# Patient Record
Sex: Female | Born: 1975 | Race: White | Hispanic: No | Marital: Married | Smoking: Never smoker
Health system: Southern US, Community
[De-identification: ages and names within clinical notes are randomized; demographics above are authoritative.]

## PROBLEM LIST (undated history)

## (undated) DIAGNOSIS — L309 Dermatitis, unspecified: Secondary | ICD-10-CM

## (undated) DIAGNOSIS — F419 Anxiety disorder, unspecified: Secondary | ICD-10-CM

## (undated) DIAGNOSIS — Z8619 Personal history of other infectious and parasitic diseases: Secondary | ICD-10-CM

## (undated) DIAGNOSIS — G4700 Insomnia, unspecified: Secondary | ICD-10-CM

## (undated) DIAGNOSIS — K219 Gastro-esophageal reflux disease without esophagitis: Secondary | ICD-10-CM

---

## 2017-10-23 ENCOUNTER — Emergency Department (INDEPENDENT_AMBULATORY_CARE_PROVIDER_SITE_OTHER)
Admission: EM | Admit: 2017-10-23 | Discharge: 2017-10-23 | Disposition: A | Discharge status: Home / Self Care | Payer: BLUE CROSS/BLUE SHIELD | Source: Home / Self Care | Attending: Family Medicine | Admitting: Family Medicine

## 2017-10-23 ENCOUNTER — Other Ambulatory Visit: Payer: Self-pay

## 2017-10-23 DIAGNOSIS — L03317 Cellulitis of buttock: Secondary | ICD-10-CM

## 2017-10-23 MED ORDER — HYDROCODONE-ACETAMINOPHEN 5-325 MG PO TABS: 1.0000 | ORAL_TABLET | Freq: Four times a day (QID) | ORAL | 0 refills | Status: DC | PRN

## 2017-10-23 MED ORDER — CLINDAMYCIN HCL 300 MG PO CAPS: 300.0000 mg | ORAL_CAPSULE | Freq: Three times a day (TID) | ORAL | 0 refills | Status: DC

## 2017-10-23 MED ORDER — CLINDAMYCIN HCL 300 MG PO CAPS: 300.0000 mg | ORAL_CAPSULE | Freq: Three times a day (TID) | ORAL | 0 refills | Status: AC

## 2017-10-23 NOTE — ED Triage Notes (Signed)
Pt reports boil/cyst on right upper leg/buttock. Has tried neosporin and bandage at home, no relief. No antibiotics. She does report it popped some on its own a "couple days ago." Does report increased pain when sitting.

## 2017-10-23 NOTE — ED Provider Notes (Signed)
[facility]    CSN: [id number] Arrival date & time: 10/23/17  1934     History   Chief Complaint Chief Complaint  Patient presents with  . Cyst    HPI Holly Peck is a 42 y.o. female.   Patient complains of one week history of swollen painful swollen area on right buttock near introitus.  The area has been oozing fluid for two days.  She has applied Neosporin without improvement.  No fevers, chills, and sweats   The history is provided by the patient and the spouse.  Abscess  Abscess location: right buttock. Size:  8cm Abscess quality: induration, painful, redness and weeping   Abscess quality: not draining, no itching and no warmth   Red streaking: no   Duration:  1 week Progression:  Worsening Pain details:    Quality:  Pressure   Severity:  Moderate   Duration:  1 week   Timing:  Constant   Progression:  Worsening Chronicity:  New Context: not skin injury   Relieved by:  Nothing Exacerbated by: sitting/walking. Ineffective treatments:  Topical antibiotics Associated symptoms: no fever     History reviewed. No pertinent past medical history.  There are no active problems to display for this patient.   History reviewed. No pertinent surgical history.  OB History    No data available       Home Medications    Prior to Admission medications   Medication Sig Start Date End Date Taking? Authorizing Provider  clonazePAM (KLONOPIN) 1 MG tablet Take 1 mg by mouth 2 (two) times daily.   Yes [provider]  DULoxetine (CYMBALTA) 60 MG capsule Take 60 mg by mouth daily.   Yes [provider]  Eszopiclone 3 MG TABS Take 3 mg by mouth at bedtime. Take immediately before bedtime   Yes [provider]  lamoTRIgine (LAMICTAL) 200 MG tablet Take 200 mg by mouth daily.   Yes [provider]  pramipexole (MIRAPEX) 1 MG tablet Take 2 mg by mouth 3 (three) times daily.   Yes [provider]  QUEtiapine (SEROQUEL)  100 MG tablet Take 100 mg by mouth at bedtime.   Yes [provider]  clindamycin (CLEOCIN) 300 MG capsule Take 1 capsule (300 mg total) by mouth 3 (three) times daily for 10 days. Take every 8 hours 10/23/17 11/02/17  Lattie HawBeese, Hunter Bachar A, MD  HYDROcodone-acetaminophen (NORCO/VICODIN) 5-325 MG tablet Take 1 tablet by mouth every 6 (six) hours as needed for moderate pain. 10/23/17   Lattie HawBeese, Adric Wrede A, MD    Family History History reviewed. No pertinent family history.  Social History Social History   Tobacco Use  . Smoking status: Never Smoker  . Smokeless tobacco: Never Used  Substance Use Topics  . Alcohol use: No    Frequency: Never  . Drug use: No     Allergies   Patient has no known allergies.   Review of Systems Review of Systems  Constitutional: Negative for fever.  All other systems reviewed and are negative.    Physical Exam Triage Vital Signs ED Triage Vitals  Enc Vitals Group     BP 10/23/17 1952 127/81     Pulse Rate 10/23/17 1952 (!) 106     Resp --      Temp 10/23/17 1952 98.6 F (37 C)     Temp Source 10/23/17 1952 Oral     SpO2 10/23/17 1952 96 %     Weight 10/23/17 1953 273 lb (123.8  kg)     Height 10/23/17 1953 5\' 5"  (1.651 m)     Head Circumference --      Peak Flow --      Pain Score 10/23/17 1953 10     Pain Loc --      Pain Edu? --      Excl. in GC? --    No data found.  Updated Vital Signs BP 127/81 (BP Location: Left Arm)   Pulse (!) 106   Temp 98.6 F (37 C) (Oral)   Ht 5\' 5"  (1.651 m)   Wt 273 lb (123.8 kg)   SpO2 96%   BMI 45.43 kg/m   Visual Acuity Right Eye Distance:   Left Eye Distance:   Bilateral Distance:    Right Eye Near:   Left Eye Near:    Bilateral Near:     Physical Exam  Constitutional: She appears well-developed and well-nourished. No distress.  HENT:  Head: Normocephalic.  Eyes: Pupils are equal, round, and reactive to light.  Neck: Neck supple.  Cardiovascular: Normal heart sounds.  Rate 106    Pulmonary/Chest: Breath sounds normal.  Abdominal: There is no tenderness.  Genitourinary:     Genitourinary Comments: Right buttock, near introitus, has 8cm by 6cm erythematous indurated area, tender to palpation.  2cm diameter central area moist and excoriated, but not fluctuant.  Musculoskeletal: She exhibits no edema.  Neurological: She is alert.  Skin: Skin is warm and dry.  Nursing note and vitals reviewed.    UC Treatments / Results  Labs (all labs ordered are listed, but only abnormal results are displayed) Labs Reviewed  WOUND CULTURE    EKG  EKG Interpretation None       Radiology No results found.  Procedures Procedures (including critical care time)  Medications Ordered in UC Medications - No data to display   Initial Impression / Assessment and Plan / UC Course  I have reviewed the triage vital signs and the nursing notes.  Pertinent labs & imaging results that were available during my care of the patient were reviewed by me and considered in my medical decision making (see chart for details).    Area indurated but not fluctuant; no indication for I and D today.  Wound culture pending.  Begin clindamycin for staph coverage. Rx for Lortab. Controlled Substance Prescriptions I have consulted the [place] Controlled Substances Registry for this patient, and feel the risk/benefit ratio today is favorable for proceeding with this prescription for a controlled substance.   Begin warm soak once or twice daily.  May also apply a heating pad.  Change bandage daily.  Keep bandage clean and dry.  May take Ibuprofen 200mg , 4 tabs every 8 hours with food.  If symptoms become significantly worse during the night or over the weekend, proceed to the local emergency room.  Return if not improving 3 to 4 days; may need I and D.    Final Clinical Impressions(s) / UC Diagnoses   Final diagnoses:  Cellulitis of buttock, right    ED Discharge Orders        Ordered     clindamycin (CLEOCIN) 300 MG capsule  3 times daily     10/23/17 2033    HYDROcodone-acetaminophen (NORCO/VICODIN) 5-325 MG tablet  Every 6 hours PRN     10/23/17 2033          Lattie Haw, MD 10/30/17 [phone]

## 2017-10-23 NOTE — Discharge Instructions (Signed)
Begin warm soak once or twice daily.  May also apply a heating pad.  Change bandage daily.  Keep bandage clean and dry.  May take Ibuprofen 200mg , 4 tabs every 8 hours with food.  If symptoms become significantly worse during the night or over the weekend, proceed to the local emergency room.

## 2017-10-26 ENCOUNTER — Telehealth: Payer: Self-pay | Admitting: Emergency Medicine

## 2017-10-26 LAB — WOUND CULTURE
MICRO NUMBER: 90188000
SPECIMEN QUALITY: ADEQUATE

## 2017-12-22 ENCOUNTER — Emergency Department (INDEPENDENT_AMBULATORY_CARE_PROVIDER_SITE_OTHER)
Admission: EM | Admit: 2017-12-22 | Discharge: 2017-12-22 | Disposition: A | Discharge status: Home / Self Care | Payer: BLUE CROSS/BLUE SHIELD | Source: Home / Self Care | Attending: Emergency Medicine | Admitting: Emergency Medicine

## 2017-12-22 ENCOUNTER — Other Ambulatory Visit: Payer: Self-pay

## 2017-12-22 DIAGNOSIS — B029 Zoster without complications: Secondary | ICD-10-CM | POA: Diagnosis not present

## 2017-12-22 DIAGNOSIS — L03112 Cellulitis of left axilla: Secondary | ICD-10-CM

## 2017-12-22 MED ORDER — VALACYCLOVIR HCL 1 G PO TABS: ORAL_TABLET | ORAL | 0 refills | Status: DC

## 2017-12-22 MED ORDER — DOXYCYCLINE HYCLATE 100 MG PO CAPS: 100.0000 mg | ORAL_CAPSULE | Freq: Two times a day (BID) | ORAL | 0 refills | Status: DC

## 2017-12-22 NOTE — Discharge Instructions (Signed)
Take the Valtrex as prescribed for shingles right side of chest. Take the doxycycline as prescribed for skin infection/cellulitis left axilla/under arm. Please read attached instruction sheets about cellulitis and shingles. Keep the areas clean and dry. Follow-up with your primary care doctor or dermatologist if not improving in 7-10 days, or sooner if worse or new symptoms

## 2017-12-22 NOTE — ED Provider Notes (Signed)
[facility]    CSN: [id number] Arrival date & time: 12/22/17  1633     History   Chief Complaint Chief Complaint  Patient presents with  . Herpes Zoster    HPI Holly Peck is a 42 y.o. female.   HPI Here with husband.  Has 2 separate concerns.  First, moderately painful, with deep pain, from rash right anterior lateral chest, which started 2 or 3 days ago.  She feels that this is shingles, as husband recently diagnosed with shingles and started treatment with Valtrex.  The right chest rash started as blisters, but the blisters have dried now.  Denies exertional chest pain or shortness of breath or cough or URI symptoms.  No abdominal pain or nausea or vomiting. Second concern is left axillary skin infection, completely separate from the right chest shingles.  She was treated about 10 days ago elsewhere for skin infection along the left axilla.  Treated with sulfa antibiotic and that moderately helped, but still has some red warm areas that she requests to be checked and treated.  No fever or chills.  No drainage.  No red streaks.  No head or neck symptoms. History reviewed. No pertinent past medical history. Past medical history pertinent for bipolar.  This is controlled on multiple psychiatric medications. There are no active problems to display for this patient.   History reviewed. No pertinent surgical history.  OB History   None    She denies chance of pregnancy.  Last menstrual period 3 weeks ago  Home Medications    Prior to Admission medications   Medication Sig Start Date End Date Taking? Authorizing Provider  clonazePAM (KLONOPIN) 1 MG tablet Take 1 mg by mouth 2 (two) times daily.    [provider]  doxycycline (VIBRAMYCIN) 100 MG capsule Take 1 capsule (100 mg total) by mouth 2 (two) times daily. For 7 days.  Antibiotic for cellulitis 12/22/17   Lajean ManesMassey, David, MD  DULoxetine (CYMBALTA) 60 MG capsule Take 60 mg by mouth daily.    [provider]  Eszopiclone 3 MG TABS Take 3 mg by mouth at bedtime. Take immediately before bedtime    [provider]  lamoTRIgine (LAMICTAL) 200 MG tablet Take 200 mg by mouth daily.    [provider]  pramipexole (MIRAPEX) 1 MG tablet Take 2 mg by mouth 3 (three) times daily.    [provider]  QUEtiapine (SEROQUEL) 100 MG tablet Take 100 mg by mouth at bedtime.    [provider]  valACYclovir (VALTREX) 1000 MG tablet Take one by mouth 3 times a day for 7 days 12/22/17   Lajean ManesMassey, David, MD    Family History History reviewed. No pertinent family history.  Social History Social History   Tobacco Use  . Smoking status: Never Smoker  . Smokeless tobacco: Never Used  Substance Use Topics  . Alcohol use: No    Frequency: Never  . Drug use: No  Denies smoking or alcohol or illegal drugs.   Allergies   Patient has no known allergies.   Review of Systems Review of Systems  All other systems reviewed and are negative.  See also HPI  Physical Exam Triage Vital Signs ED Triage Vitals [12/22/17 1645]  Enc Vitals Group     BP 116/80     Pulse Rate (!) 113     Resp 18     Temp 98.4 F (36.9 C)     Temp Source Oral  SpO2 95 %     Weight 261 lb (118.4 kg)     Height 5\' 5"  (1.651 m)     Head Circumference      Peak Flow      Pain Score 0     Pain Loc      Pain Edu?      Excl. in GC?    No data found.  Updated Vital Signs BP 116/80 (BP Location: Right Arm)   Pulse (!) 113   Temp 98.4 F (36.9 C) (Oral)   Resp 18   Ht 5\' 5"  (1.651 m)   Wt 261 lb (118.4 kg)   SpO2 95%   BMI 43.43 kg/m   Visual Acuity Right Eye Distance:   Left Eye Distance:   Bilateral Distance:    Right Eye Near:   Left Eye Near:    Bilateral Near:     Physical Exam  Constitutional: She is oriented to person, place, and time. She appears well-developed and well-nourished. No distress.  Pleasant female, no acute distress.  HENT:  Head:  Normocephalic and atraumatic.  Mouth/Throat: Oropharynx is clear and moist.  Eyes: Pupils are equal, round, and reactive to light. No scleral icterus.  Neck: Normal range of motion. Neck supple.  Cardiovascular: Normal rate, regular rhythm and normal heart sounds.  Pulmonary/Chest: Effort normal and breath sounds normal.  Abdominal: She exhibits no distension.  Neurological: She is alert and oriented to person, place, and time. No cranial nerve deficit.  Skin: Skin is warm and dry. Capillary refill takes less than 2 seconds.  See details below  Psychiatric: She has a normal mood and affect. Her behavior is normal.  Vitals reviewed. Pulse repeated, 96, regular Skin:  Right anterior-lateral chest, in a dermatome distribution, erythematous maculopapular rash.  Some dried vesicles but no active vesicles.  No fluctuance or induration or red streaks.  No heat.  5 mm, mobile, slightly tender right axillary node Left lateral chest/axilla: Erythematous, macular, warm areas without fluctuance.  Some induration.  No red streaks.  Small, 5 mm mobile, slightly tender left axillary node   UC Treatments / Results  Labs (all labs ordered are listed, but only abnormal results are displayed) Labs Reviewed - No data to display  EKG None Radiology No results found.  Procedures Procedures (including critical care time)  Medications Ordered in UC Medications - No data to display   Initial Impression / Assessment and Plan / UC Course  I have reviewed the triage vital signs and the nursing notes.  Pertinent labs & imaging results that were available during my care of the patient were reviewed by me and considered in my medical decision making (see chart for details).     She likely has 2 separate dermatologic problems.  First is right-sided herpes zoster/shingles.  Second is partially treated cellulitis of left axilla.  Treatment options discussed at length with patient and husband and they agree  with the following plans, with written instructions with AVS and also verbal instructions given. We discussed the option of prednisone to possibly help postherpetic neuralgia, but given her psychiatric history, it was felt that the risks of prednisone outweighed the potential benefits of prednisone, so therefore prednisone was not prescribed.  Patient and husband agree An After Visit Summary was printed and given to the patient and husband. "Take the Valtrex as prescribed for shingles right side of chest. Take the doxycycline as prescribed for skin infection/cellulitis left axilla/under arm. Please read attached instruction sheets about  cellulitis and shingles. Keep the areas clean and dry. Follow-up with your primary care doctor or dermatologist if not improving in 7-10 days, or sooner if worse or new symptoms"  Follow-up with your primary care doctor in 7-10 days if not improving, or sooner if symptoms become worse. Also advised that follow-up physician needs to recheck and be sure the axillary lymph nodes have resolved.  Also advised to see PCP for all preventative care and to be sure to have mammogram at least annually, or sooner if axillary lymph nodes do not resolve.  Explained the vital importance of this, and the risks of not doing so. Precautions discussed. Red flags discussed. Questions invited and answered. They voiced understanding and agreement.  Final Clinical Impressions(s) / UC Diagnoses   Final diagnoses:  Herpes zoster without complication  Cellulitis of axilla, left    ED Discharge Orders        Ordered    valACYclovir (VALTREX) 1000 MG tablet     12/22/17 1700    doxycycline (VIBRAMYCIN) 100 MG capsule  2 times daily     12/22/17 1700    Valtrex 1000 mg 3 times daily times 7 days. Doxycycline 100 mg twice daily times 7 days She declined any prescription pain med, and prefers to use Tylenol or ibuprofen as needed for pain  Controlled Substance Prescriptions [place]  Controlled Substance Registry consulted? Not Applicable   Lajean Manes, MD 12/22/17 1932

## 2017-12-22 NOTE — ED Triage Notes (Signed)
Pt c/o shingles like rash x 1 week under R arm. [facility].

## 2018-01-14 ENCOUNTER — Encounter: Payer: Self-pay | Admitting: Emergency Medicine

## 2018-01-14 ENCOUNTER — Emergency Department (INDEPENDENT_AMBULATORY_CARE_PROVIDER_SITE_OTHER)
Admission: EM | Admit: 2018-01-14 | Discharge: 2018-01-14 | Disposition: A | Discharge status: Home / Self Care | Payer: BLUE CROSS/BLUE SHIELD | Source: Home / Self Care | Attending: Family Medicine | Admitting: Family Medicine

## 2018-01-14 DIAGNOSIS — B029 Zoster without complications: Secondary | ICD-10-CM | POA: Diagnosis not present

## 2018-01-14 MED ORDER — ACYCLOVIR 800 MG PO TABS: 800.0000 mg | ORAL_TABLET | Freq: Every day | ORAL | 0 refills | Status: DC

## 2018-01-14 MED ORDER — TRIAMCINOLONE ACETONIDE 0.1 % EX CREA: 1.0000 "application " | TOPICAL_CREAM | Freq: Two times a day (BID) | CUTANEOUS | 1 refills | Status: DC

## 2018-01-14 NOTE — ED Triage Notes (Signed)
Pt c/o rash on right arm. States she was dx with shingles x2 weeks ago and this feels the same. She did complete meds.

## 2018-01-14 NOTE — ED Provider Notes (Signed)
[name]    CSN: [id number] Arrival date & time: 01/14/18  1658     History   Chief Complaint Chief Complaint  Patient presents with  . Rash    HPI Holly Peck is a 42 y.o. female.   Patient was treated for herpes zoster 12/22/17 on her right anterior/lateral chest.  She reports persistent painful rash over her right chest and breast.  No fevers, chills, and sweats.  She feels well otherwise.  The history is provided by the patient.  Rash  Location:  Torso Torso rash location:  R breast and R chest Quality: burning, dryness, painful and redness   Quality: not blistering, not draining, not itchy, not peeling, not scaling, not swelling and not weeping   Pain details:    Quality:  Burning   Severity:  Mild   Onset quality:  Gradual   Duration:  3 weeks   Timing:  Constant   Progression:  Unchanged Severity:  Moderate Onset quality:  Gradual Duration:  3 weeks Timing:  Constant Relieved by:  Nothing Worsened by:  Contact Ineffective treatments: Valtrex. Associated symptoms: no abdominal pain, no fatigue, no fever, no induration, no joint pain, no myalgias and no nausea     History reviewed. No pertinent past medical history.  There are no active problems to display for this patient.   History reviewed. No pertinent surgical history.  OB History   None      Home Medications    Prior to Admission medications   Medication Sig Start Date End Date Taking? Authorizing Provider  acyclovir (ZOVIRAX) 800 MG tablet Take 1 tablet (800 mg total) by mouth 5 (five) times daily. 01/14/18   Lattie Haw, MD  clonazePAM (KLONOPIN) 1 MG tablet Take 1 mg by mouth 2 (two) times daily.    [provider]  doxycycline (VIBRAMYCIN) 100 MG capsule Take 1 capsule (100 mg total) by mouth 2 (two) times daily. For 7 days.  Antibiotic for cellulitis 12/22/17   Lajean Manes, MD  DULoxetine (CYMBALTA) 60 MG capsule Take 60 mg by mouth daily.    [provider]  Eszopiclone 3 MG TABS Take 3 mg by mouth at bedtime. Take immediately before bedtime    [provider]  lamoTRIgine (LAMICTAL) 200 MG tablet Take 200 mg by mouth daily.    [provider]  pramipexole (MIRAPEX) 1 MG tablet Take 2 mg by mouth 3 (three) times daily.    [provider]  QUEtiapine (SEROQUEL) 100 MG tablet Take 100 mg by mouth at bedtime.    [provider]  triamcinolone cream (KENALOG) 0.1 % Apply 1 application topically 2 (two) times daily. 01/14/18   Lattie Haw, MD  valACYclovir (VALTREX) 1000 MG tablet Take one by mouth 3 times a day for 7 days 12/22/17   Lajean Manes, MD    Family History History reviewed. No pertinent family history.  Social History Social History   Tobacco Use  . Smoking status: Never Smoker  . Smokeless tobacco: Never Used  Substance Use Topics  . Alcohol use: No    Frequency: Never  . Drug use: No     Allergies   Patient has no known allergies.   Review of Systems Review of Systems  Constitutional: Negative for fatigue and fever.  Gastrointestinal: Negative for abdominal pain and nausea.  Musculoskeletal: Negative for arthralgias and myalgias.  Skin: Positive for rash.  All other systems reviewed and are negative.    Physical  Exam Triage Vital Signs ED Triage Vitals  Enc Vitals Group     BP 01/14/18 1750 120/84     Pulse Rate 01/14/18 1750 78     Resp --      Temp 01/14/18 1750 98.3 F (36.8 C)     Temp Source 01/14/18 1750 Oral     SpO2 --      Weight 01/14/18 1751 257 lb (116.6 kg)     Height --      Head Circumference --      Peak Flow --      Pain Score 01/14/18 1750 0     Pain Loc --      Pain Edu? --      Excl. in GC? --    No data found.  Updated Vital Signs BP 120/84 (BP Location: Right Arm)   Pulse 78   Temp 98.3 F (36.8 C) (Oral)   Wt 257 lb (116.6 kg)   BMI 42.77 kg/m   Visual Acuity Right Eye Distance:   Left Eye Distance:   Bilateral  Distance:    Right Eye Near:   Left Eye Near:    Bilateral Near:     Physical Exam  Constitutional: She appears well-developed and well-nourished. No distress.  HENT:  Head: Normocephalic.  Right Ear: External ear normal.  Left Ear: External ear normal.  Nose: Nose normal.  Mouth/Throat: Oropharynx is clear and moist.  Eyes: Pupils are equal, round, and reactive to light. Conjunctivae are normal.  Neck: Neck supple.  Cardiovascular: Normal rate.  Pulmonary/Chest: Effort normal.  Right anterior chest reveals herpetiform eruption as noted on diagram.  No warmth, induration, or fluctuance.  Mild tenderness to palpation.  No vesicles.    Musculoskeletal: She exhibits no edema.  Lymphadenopathy:    She has no cervical adenopathy.  Neurological: She is alert.  Skin: Skin is warm and dry.  Nursing note and vitals reviewed.    UC Treatments / Results  Labs (all labs ordered are listed, but only abnormal results are displayed) Labs Reviewed - No data to display  EKG None  Radiology No results found.  Procedures Procedures (including critical care time)  Medications Ordered in UC Medications - No data to display  Initial Impression / Assessment and Plan / UC Course  I have reviewed the triage vital signs and the nursing notes.  Pertinent labs & imaging results that were available during my care of the patient were reviewed by me and considered in my medical decision making (see chart for details).    Begin acyclovir, and topical triamcinolone cream. Followup with dermatologist if not improved one week.   Final Clinical Impressions(s) / UC Diagnoses   Final diagnoses:  Herpes zoster without complication   Discharge Instructions   None    ED Prescriptions    Medication Sig Dispense Auth. Provider   acyclovir (ZOVIRAX) 800 MG tablet Take 1 tablet (800 mg total) by mouth 5 (five) times daily. 35 tablet Lattie Haw, MD   triamcinolone cream (KENALOG) 0.1 %  Apply 1 application topically 2 (two) times daily. 30 g Lattie Haw, MD        Lattie Haw, MD 01/16/18 [phone]

## 2018-03-25 ENCOUNTER — Emergency Department (INDEPENDENT_AMBULATORY_CARE_PROVIDER_SITE_OTHER)
Admission: EM | Admit: 2018-03-25 | Discharge: 2018-03-25 | Disposition: A | Discharge status: Home / Self Care | Payer: BLUE CROSS/BLUE SHIELD | Source: Home / Self Care | Attending: Family Medicine | Admitting: Family Medicine

## 2018-03-25 ENCOUNTER — Other Ambulatory Visit: Payer: Self-pay

## 2018-03-25 DIAGNOSIS — R21 Rash and other nonspecific skin eruption: Secondary | ICD-10-CM

## 2018-03-25 MED ORDER — HYDROXYZINE PAMOATE 50 MG PO CAPS: 50.0000 mg | ORAL_CAPSULE | Freq: Four times a day (QID) | ORAL | 0 refills | Status: DC | PRN

## 2018-03-25 MED ORDER — METHYLPREDNISOLONE SODIUM SUCC 125 MG IJ SOLR
80.0000 mg | Freq: Once | INTRAMUSCULAR | Status: AC
  Administered 2018-03-25: 80 mg via INTRAMUSCULAR

## 2018-03-25 MED ORDER — DOXYCYCLINE HYCLATE 100 MG PO CAPS: 100.0000 mg | ORAL_CAPSULE | Freq: Two times a day (BID) | ORAL | 0 refills | Status: DC

## 2018-03-25 MED ORDER — PREDNISONE 20 MG PO TABS: ORAL_TABLET | ORAL | 0 refills | Status: DC

## 2018-03-25 MED ORDER — FLUCONAZOLE 150 MG PO TABS: ORAL_TABLET | ORAL | 1 refills | Status: DC

## 2018-03-25 NOTE — ED Triage Notes (Signed)
Pt has had a rash ongoing for over 2 months.  Severe itching

## 2018-03-25 NOTE — Discharge Instructions (Addendum)
Begin prednisone Tuesday 03/26/18. May try [place] soaks for weeping rash.

## 2018-03-26 NOTE — ED Provider Notes (Signed)
[facility]    CSN: [id number] Arrival date & time: 03/25/18  1426     History   Chief Complaint Chief Complaint  Patient presents with  . Rash    HPI Antionette CharLisa Niemeier is a 42 y.o. female.   Patient complains of at least two month history of persistent recurring pruritic on extremities and anterior trunk.  Review of records reveals that she has had several ER visits for rash since April 2019, having been treated with a variety of medications including Valtrex, antibiotics, and topical steroids.  Previous notes indicate that she has a history of psoriasis.  She has not visited a dermatologist for her problem.  She feels well otherwise.  No fevers, chills, and sweats.  The history is provided by the patient.  Rash  Location: upper and lower extremities, and anterior torso. Quality: dryness, itchiness, painful, redness, scaling and weeping   Quality: not blistering, not bruising, not burning, not draining, not peeling and not swelling   Pain details:    Quality:  Itching   Severity:  Moderate   Onset quality:  Gradual   Duration:  4 months   Timing:  Constant   Progression:  Worsening Severity:  Moderate Onset quality:  Gradual Duration:  4 months Timing:  Constant Progression:  Worsening Chronicity:  Chronic Context: not animal contact, not food, not hot tub use, not insect bite/sting, not medications, not new detergent/soap and not plant contact   Relieved by:  Nothing Worsened by:  Heat Ineffective treatments:  Antihistamines and topical steroids Associated symptoms: no abdominal pain, no fatigue, no fever, no induration, no joint pain, no myalgias and no nausea     History reviewed. No pertinent past medical history.  There are no active problems to display for this patient.   History reviewed. No pertinent surgical history.  OB History   None      Home Medications    Prior to Admission medications   Medication Sig Start Date End Date Taking?  Authorizing Provider  acyclovir (ZOVIRAX) 800 MG tablet Take 1 tablet (800 mg total) by mouth 5 (five) times daily. 01/14/18   Lattie HawBeese, Humaira Sculley A, MD  clonazePAM (KLONOPIN) 1 MG tablet Take 1 mg by mouth 2 (two) times daily.    [provider]  doxycycline (VIBRAMYCIN) 100 MG capsule Take 1 capsule (100 mg total) by mouth 2 (two) times daily. For 7 days.  Antibiotic for cellulitis 03/25/18   Lattie HawBeese, Caileen Veracruz A, MD  DULoxetine (CYMBALTA) 60 MG capsule Take 60 mg by mouth daily.    [provider]  Eszopiclone 3 MG TABS Take 3 mg by mouth at bedtime. Take immediately before bedtime    [provider]  fluconazole (DIFLUCAN) 150 MG tablet Take one tab PO daily for 5 days. 03/25/18   Lattie HawBeese, Shawnte Demarest A, MD  hydrOXYzine (VISTARIL) 50 MG capsule Take 1 capsule (50 mg total) by mouth every 6 (six) hours as needed. (for itching) 03/25/18   Lattie HawBeese, Faris Coolman A, MD  lamoTRIgine (LAMICTAL) 200 MG tablet Take 200 mg by mouth daily.    [provider]  pramipexole (MIRAPEX) 1 MG tablet Take 2 mg by mouth 3 (three) times daily.    [provider]  predniSONE (DELTASONE) 20 MG tablet Take one tab by mouth twice daily for 4 days, then one daily for 3 days. Take with food. 03/25/18   Lattie HawBeese, Del Overfelt A, MD  QUEtiapine (SEROQUEL) 100 MG tablet Take 100 mg by mouth at bedtime.  [provider]  triamcinolone cream (KENALOG) 0.1 % Apply 1 application topically 2 (two) times daily. 01/14/18   Lattie Haw, MD  valACYclovir (VALTREX) 1000 MG tablet Take one by mouth 3 times a day for 7 days 12/22/17   Lajean Manes, MD    Family History History reviewed. No pertinent family history.  Social History Social History   Tobacco Use  . Smoking status: Never Smoker  . Smokeless tobacco: Never Used  Substance Use Topics  . Alcohol use: No    Frequency: Never  . Drug use: No     Allergies   Patient has no known allergies.   Review of Systems Review of Systems    Constitutional: Negative for fatigue and fever.  Gastrointestinal: Negative for abdominal pain and nausea.  Musculoskeletal: Negative for arthralgias and myalgias.  Skin: Positive for rash.  All other systems reviewed and are negative.    Physical Exam Triage Vital Signs ED Triage Vitals  Enc Vitals Group     BP 03/25/18 1443 118/83     Pulse Rate 03/25/18 1443 98     Resp --      Temp 03/25/18 1443 98.1 F (36.7 C)     Temp Source 03/25/18 1443 Oral     SpO2 03/25/18 1443 95 %     Weight 03/25/18 1444 268 lb (121.6 kg)     Height 03/25/18 1444 5\' 6"  (1.676 m)     Head Circumference --      Peak Flow --      Pain Score 03/25/18 1443 0     Pain Loc --      Pain Edu? --      Excl. in GC? --    No data found.  Updated Vital Signs BP 118/83 (BP Location: Right Arm)   Pulse 98   Temp 98.1 F (36.7 C) (Oral)   Ht 5\' 6"  (1.676 m)   Wt 268 lb (121.6 kg)   LMP 03/02/2018   SpO2 95%   BMI 43.26 kg/m   Visual Acuity Right Eye Distance:   Left Eye Distance:   Bilateral Distance:    Right Eye Near:   Left Eye Near:    Bilateral Near:     Physical Exam  Constitutional: She appears well-developed and well-nourished. No distress.  HENT:  Head: Normocephalic.  Right Ear: External ear normal.  Left Ear: External ear normal.  Nose: Nose normal.  Mouth/Throat: Oropharynx is clear and moist.  Eyes: Pupils are equal, round, and reactive to light. Conjunctivae are normal.  Neck: Neck supple.  Cardiovascular: Normal heart sounds.  Pulmonary/Chest: Breath sounds normal.  Abdominal: There is no tenderness.  Musculoskeletal: She exhibits no edema.  Lymphadenopathy:    She has no cervical adenopathy.  Neurological: She is alert.  Skin: Skin is warm and dry. Rash noted.     Numerous chronic appearing macular erythematous lesions on extremities and anterior torso.  Some lesions are plaque-like, and several are weeping clear fluid (but not ulcerated).  No tenderness,  induration, or fluctuance.  Nursing note and vitals reviewed.    UC Treatments / Results  Labs (all labs ordered are listed, but only abnormal results are displayed) Labs Reviewed  WOUND CULTURE  POCT KOH prep:  Hyphae present.  EKG None  Radiology No results found.  Procedures Procedures (including critical care time)  Medications Ordered in UC Medications  methylPREDNISolone sodium succinate (SOLU-MEDROL) 125 mg/2 mL injection 80 mg (80 mg Intramuscular Given 03/25/18 1622)  Initial Impression / Assessment and Plan / UC Course  I have reviewed the triage vital signs and the nursing notes.  Pertinent labs & imaging results that were available during my care of the patient were reviewed by me and considered in my medical decision making (see chart for details).    Suspect underlying psoriasis with secondary bacterial and fungal infections. Culture pending from several moist lesions on legs. Administered Solumedrol 80mg  IM, followed by oral prednisone burst/taper. Initially ordered doxycycline for staph coverage, but patient called back and stated that doxycycline caused nausea.  Rx for Clindamycin 300mg  Q8hr. Begin Diflucan 150 once daily for 5 days. Rx Vistaril 50mg  for itching. Recommend that patient follow-up with dermatologist for management of her chronic skin condition.  Final Clinical Impressions(s) / UC Diagnoses   Final diagnoses:  Rash  Rash and nonspecific skin eruption     Discharge Instructions     Begin prednisone Tuesday 03/26/18. May try Dome[place] soaks for weeping rash.   ED Prescriptions    Medication Sig Dispense Auth. Provider   doxycycline (VIBRAMYCIN) 100 MG capsule Take 1 capsule (100 mg total) by mouth 2 (two) times daily. For 7 days.  Antibiotic for cellulitis 14 capsule Lattie Haw, MD   predniSONE (DELTASONE) 20 MG tablet Take one tab by mouth twice daily for 4 days, then one daily for 3 days. Take with food. 11 tablet Lattie Haw, MD   fluconazole (DIFLUCAN) 150 MG tablet Take one tab PO daily for 5 days. 5 tablet Lattie Haw, MD   hydrOXYzine (VISTARIL) 50 MG capsule Take 1 capsule (50 mg total) by mouth every 6 (six) hours as needed. (for itching) 30 capsule Lattie Haw, MD         Lattie Haw, MD 03/26/18 1210

## 2018-03-28 ENCOUNTER — Telehealth: Payer: Self-pay | Admitting: Emergency Medicine

## 2018-03-28 LAB — WOUND CULTURE
MICRO NUMBER:: 90836744
RESULT: NO GROWTH
SPECIMEN QUALITY:: ADEQUATE

## 2018-03-28 NOTE — Telephone Encounter (Signed)
No bacteria grown in culture, hope she is getting better.

## 2018-04-12 ENCOUNTER — Emergency Department (INDEPENDENT_AMBULATORY_CARE_PROVIDER_SITE_OTHER): Payer: BLUE CROSS/BLUE SHIELD

## 2018-04-12 ENCOUNTER — Encounter: Payer: Self-pay | Admitting: Emergency Medicine

## 2018-04-12 ENCOUNTER — Emergency Department (INDEPENDENT_AMBULATORY_CARE_PROVIDER_SITE_OTHER)
Admission: EM | Admit: 2018-04-12 | Discharge: 2018-04-12 | Disposition: A | Discharge status: Home / Self Care | Payer: BLUE CROSS/BLUE SHIELD | Source: Home / Self Care | Attending: Emergency Medicine | Admitting: Emergency Medicine

## 2018-04-12 DIAGNOSIS — M25522 Pain in left elbow: Secondary | ICD-10-CM | POA: Diagnosis not present

## 2018-04-12 DIAGNOSIS — M25512 Pain in left shoulder: Secondary | ICD-10-CM

## 2018-04-12 DIAGNOSIS — S40012A Contusion of left shoulder, initial encounter: Secondary | ICD-10-CM | POA: Diagnosis not present

## 2018-04-12 DIAGNOSIS — S5002XA Contusion of left elbow, initial encounter: Secondary | ICD-10-CM

## 2018-04-12 MED ORDER — KETOROLAC TROMETHAMINE 60 MG/2ML IM SOLN
60.0000 mg | Freq: Once | INTRAMUSCULAR | Status: AC
  Administered 2018-04-12: 60 mg via INTRAMUSCULAR

## 2018-04-12 MED ORDER — CYCLOBENZAPRINE HCL 10 MG PO TABS: 10.0000 mg | ORAL_TABLET | Freq: Every day | ORAL | 0 refills | Status: DC

## 2018-04-12 MED ORDER — MELOXICAM 7.5 MG PO TABS: ORAL_TABLET | ORAL | 0 refills | Status: DC

## 2018-04-12 NOTE — ED Provider Notes (Signed)
[facility]    CSN: [id number] Arrival date & time: 04/12/18  1308     History   Chief Complaint Chief Complaint  Patient presents with  . Arm Pain    HPI Holly Peck is a 42 y.o. female.   The history is provided by the patient.  Arm Pain  This is a new problem. The current episode started 6 to 12 hours ago. The problem occurs constantly. The problem has been gradually worsening. Pertinent negatives include no chest pain, no abdominal pain, no headaches and no shortness of breath. The symptoms are aggravated by bending. The symptoms are relieved by rest. She has tried nothing for the symptoms.  Fell in a ditch 8 hours ago landing on left elbow and shoulder.  Pain is 5 out of 10 at rest.  Pain is 10 out of 10 left elbow and shoulder with movement.  Hurts to move.  No radiation.  No associated shortness of breath or chest pain.  No loss of consciousness.  No headache or neck pain or focal neurologic symptoms.  History reviewed. No pertinent past medical history. Past medical history of staph cellulitis left arm, which is now much improving on antibiotic.  She will be following up with PCP for this. There are no active problems to display for this patient.   History reviewed. No pertinent surgical history.  OB History   None      Home Medications    Prior to Admission medications   Medication Sig Start Date End Date Taking? Authorizing Provider  acyclovir (ZOVIRAX) 800 MG tablet Take 1 tablet (800 mg total) by mouth 5 (five) times daily. 01/14/18   Lattie HawBeese, Stephen A, MD  clonazePAM (KLONOPIN) 1 MG tablet Take 1 mg by mouth 2 (two) times daily.    [provider]  cyclobenzaprine (FLEXERIL) 10 MG tablet Take 1 tablet (10 mg total) by mouth at bedtime. If needed, for muscle relaxant 04/12/18   Lajean ManesMassey, David, MD  doxycycline (VIBRAMYCIN) 100 MG capsule Take 1 capsule (100 mg total) by mouth 2 (two) times daily. For 7 days.  Antibiotic for cellulitis 03/25/18    Lattie HawBeese, Stephen A, MD  DULoxetine (CYMBALTA) 60 MG capsule Take 60 mg by mouth daily.    [provider]  Eszopiclone 3 MG TABS Take 3 mg by mouth at bedtime. Take immediately before bedtime    [provider]  fluconazole (DIFLUCAN) 150 MG tablet Take one tab PO daily for 5 days. 03/25/18   Lattie HawBeese, Stephen A, MD  hydrOXYzine (VISTARIL) 50 MG capsule Take 1 capsule (50 mg total) by mouth every 6 (six) hours as needed. (for itching) 03/25/18   Lattie HawBeese, Stephen A, MD  lamoTRIgine (LAMICTAL) 200 MG tablet Take 200 mg by mouth daily.    [provider]  meloxicam (MOBIC) 7.5 MG tablet Take 1 twice a day as needed for pain. Take with food. (Do not take with Motrin or any other NSAID.) 04/12/18   Lajean ManesMassey, David, MD  pramipexole (MIRAPEX) 1 MG tablet Take 2 mg by mouth 3 (three) times daily.    [provider]  predniSONE (DELTASONE) 20 MG tablet Take one tab by mouth twice daily for 4 days, then one daily for 3 days. Take with food. 03/25/18   Lattie HawBeese, Stephen A, MD  QUEtiapine (SEROQUEL) 100 MG tablet Take 100 mg by mouth at bedtime.    [provider]  triamcinolone cream (KENALOG) 0.1 % Apply 1 application topically 2 (two) times daily. 01/14/18  Lattie Haw, MD  valACYclovir (VALTREX) 1000 MG tablet Take one by mouth 3 times a day for 7 days 12/22/17   Lajean Manes, MD    Family History History reviewed. No pertinent family history. He denies family history of any severe musculoskeletal problems. Social History Social History   Tobacco Use  . Smoking status: Never Smoker  . Smokeless tobacco: Never Used  Substance Use Topics  . Alcohol use: No    Frequency: Never  . Drug use: No  Denies smoking or alcohol or drug use   Allergies   Percocet [oxycodone-acetaminophen] He states Percocet has caused itch in the past but not generalized reaction.  Review of Systems Review of Systems  Respiratory: Negative for shortness of breath.   Cardiovascular:  Negative for chest pain.  Gastrointestinal: Negative for abdominal pain.  Neurological: Negative for headaches.  All other systems reviewed and are negative. Staph skin infection left arm is significantly improving on antibiotics, now followed by PCP for this. No fever or chills or nausea or vomiting. Pertinent items noted in HPI and remainder of comprehensive ROS otherwise negative.    Physical Exam Triage Vital Signs ED Triage Vitals  Enc Vitals Group     BP 04/12/18 1422 112/80     Pulse Rate 04/12/18 1422 100     Resp --      Temp 04/12/18 1422 98 F (36.7 C)     Temp Source 04/12/18 1422 Oral     SpO2 04/12/18 1422 96 %     Weight 04/12/18 1423 265 lb (120.2 kg)     Height --      Head Circumference --      Peak Flow --      Pain Score 04/12/18 1423 0     Pain Loc --      Pain Edu? --      Excl. in GC? --    No data found.  Updated Vital Signs BP 112/80 (BP Location: Right Arm)   Pulse 100   Temp 98 F (36.7 C) (Oral)   Wt 265 lb (120.2 kg)   LMP 04/01/2018   SpO2 96%   BMI 42.77 kg/m   Physical Exam  Constitutional: Uncomfortable from left upper extremity pain.   She is oriented to person, place, and time. She appears well-developed and well-nourished.  No acute cardiorespiratory distress.  She can ambulate normally. HENT: Oropharynx, oral mucous membranes moist Head: Normocephalic and atraumatic.  Eyes: Pupils are equal, round, and reactive to light. No scleral icterus.  Neck: Normal range of motion. Neck supple. Nontender.  Range of motion normal neck. Back: No spinal tenderness or deformity Cardiovascular: Normal rate and regular rhythm.  Pulmonary/Chest: Effort normal. Equal expansion bilaterally. Abdominal: She exhibits no distension. Nontender Neurological: No motor or sensory deficits.  She is alert and oriented to person, place, and time. No cranial nerve deficit.  Skin: Skin is warm and dry. Dry, well-healing papular lesions left forearm.  No  fluctuance or drainage or red streaks. Left shoulder: Minimal diffuse swelling minimal diffuse ecchymosis.  Diffusely tender left anterior shoulder.  Range of motion markedly limited.  She splints her left shoulder to avoid movement.  Nontender over clavicle. No open wound of left shoulder.  No instability. Left upper arm: Diffusely tender but no induration or open wound or ecchymosis. Left elbow: Mild diffuse swelling with decreased range of motion.  Very tender diffusely to palpation.  No instability. Neurovascular left upper extremity distally intact. Nontender without deformity  left forearm, left hand, left wrist. Lower extremities: No cyanosis clubbing or edema. Psychiatric: She has a normal mood and affect. Her behavior is normal.  Vitals reviewed.  UC Treatments / Results  Labs (all labs ordered are listed, but only abnormal results are displayed) Labs Reviewed - No data to display  EKG None  Radiology Dg Elbow Complete Left  Result Date: 04/12/2018 CLINICAL DATA:  Fall.  Elbow pain EXAM: LEFT ELBOW - COMPLETE 3+ VIEW COMPARISON:  None. FINDINGS: There is no evidence of fracture, dislocation, or joint effusion. There is no evidence of arthropathy or other focal bone abnormality. Soft tissues are unremarkable. IMPRESSION: Negative. Electronically Signed   By: Marlan Palau M.D.   On: 04/12/2018 15:22   Dg Shoulder Left  Result Date: 04/12/2018 CLINICAL DATA:  Fall today with severe left shoulder and elbow pain. Initial encounter. EXAM: LEFT SHOULDER - 2+ VIEW COMPARISON:  None. FINDINGS: There is no evidence of fracture or dislocation. There is no evidence of arthropathy or other focal bone abnormality. Soft tissues are unremarkable. IMPRESSION: Negative. Electronically Signed   By: Marnee Spring M.D.   On: 04/12/2018 15:21    Procedures Procedures (including critical care time)  Medications Ordered in UC Medications  ketorolac (TORADOL) injection 60 mg (60 mg Intramuscular  Given 04/12/18 1506)    Initial Impression / Assessment and Plan / UC Course  I have reviewed the triage vital signs and the nursing notes.  Pertinent labs & imaging results that were available during my care of the patient were reviewed by me and considered in my medical decision making (see chart for details).     X-rays left shoulder and left elbow negative for fracture dislocation.  Reviewed with patient.  Gave her printed copy of x-ray reports. Final Clinical Impressions(s) / UC Diagnoses   Final diagnoses:  Contusion of left shoulder, initial encounter  Contusion of left elbow, initial encounter  Discussed at length with patient.. Treatment options discussed. She agrees with discharge instructions, given printed AVS, and reviewed verbally with patient.  She voiced understanding and agreement.  Red Flags discussed. The patient was given clear instructions to go to ER or return to medical center if any red flags develop, symptoms do not improve, worsen or new problems develop. They verbalized understanding.    Discharge Instructions     X-rays of left shoulder and left elbow are negative for fracture or dislocation. Today, we placed you in left shoulder immobilizer.  Wear this shoulder immobilizer for the next 2 to 3 days. Apply ice for the next 24 hours.  Keep left arm elevated on a pillow. I have prescribed meloxicam twice a day for pain and Flexeril, 1 at bedtime if needed for muscle relaxant. Follow-up with sports medicine or orthopedist if not improving in 1 week, or go to emergency room sooner or if if any severe or worsening symptoms    ED Prescriptions    Medication Sig Dispense Auth. Provider   meloxicam (MOBIC) 7.5 MG tablet Take 1 twice a day as needed for pain. Take with food. (Do not take with Motrin or any other NSAID.) 20 tablet Lajean Manes, MD   cyclobenzaprine (FLEXERIL) 10 MG tablet Take 1 tablet (10 mg total) by mouth at bedtime. If needed, for muscle  relaxant 7 tablet Lajean Manes, MD     Controlled Substance Prescriptions [place] Controlled Substance Registry consulted? No   Lajean Manes, MD 04/12/18 [id number]

## 2018-04-12 NOTE — ED Triage Notes (Signed)
Pt c/o left arm pain after falling in a ditch this am while walking. States she landed on her arm when she fell.

## 2018-04-12 NOTE — Discharge Instructions (Addendum)
X-rays of left shoulder and left elbow are negative for fracture or dislocation. Today, we placed you in left shoulder immobilizer.  Wear this shoulder immobilizer for the next 2 to 3 days. Apply ice for the next 24 hours.  Keep left arm elevated on a pillow. I have prescribed meloxicam twice a day for pain and Flexeril, 1 at bedtime if needed for muscle relaxant. Follow-up with sports medicine or orthopedist if not improving in 1 week, or go to emergency room sooner or if if any severe or worsening symptoms

## 2018-06-07 ENCOUNTER — Other Ambulatory Visit: Payer: Self-pay

## 2018-06-07 ENCOUNTER — Encounter: Payer: Self-pay | Admitting: Emergency Medicine

## 2018-06-07 ENCOUNTER — Emergency Department (INDEPENDENT_AMBULATORY_CARE_PROVIDER_SITE_OTHER)
Admission: EM | Admit: 2018-06-07 | Discharge: 2018-06-07 | Disposition: A | Discharge status: Home / Self Care | Payer: BLUE CROSS/BLUE SHIELD | Source: Home / Self Care | Attending: Family Medicine | Admitting: Family Medicine

## 2018-06-07 DIAGNOSIS — L309 Dermatitis, unspecified: Secondary | ICD-10-CM

## 2018-06-07 HISTORY — DX: Dermatitis, unspecified: L30.9

## 2018-06-07 HISTORY — DX: Anxiety disorder, unspecified: F41.9

## 2018-06-07 HISTORY — DX: Personal history of other infectious and parasitic diseases: Z86.19

## 2018-06-07 MED ORDER — HYDROXYZINE PAMOATE 50 MG PO CAPS: 50.0000 mg | ORAL_CAPSULE | Freq: Four times a day (QID) | ORAL | 1 refills | Status: DC | PRN

## 2018-06-07 MED ORDER — TRIAMCINOLONE ACETONIDE 0.1 % EX CREA: 1.0000 "application " | TOPICAL_CREAM | Freq: Two times a day (BID) | CUTANEOUS | 0 refills | Status: DC

## 2018-06-07 MED ORDER — PREDNISONE 20 MG PO TABS: ORAL_TABLET | ORAL | 0 refills | Status: DC

## 2018-06-07 MED ORDER — METHYLPREDNISOLONE SODIUM SUCC 125 MG IJ SOLR
80.0000 mg | Freq: Once | INTRAMUSCULAR | Status: AC
  Administered 2018-06-07: 80 mg via INTRAMUSCULAR

## 2018-06-07 NOTE — ED Triage Notes (Signed)
Patient has been here before for rashes; current rash for past 3 months covering all but her face, neck and upper chest. She also has chronic eczema.

## 2018-06-07 NOTE — Discharge Instructions (Signed)
Begin prednisone Saturday 06/08/18.

## 2018-06-07 NOTE — ED Provider Notes (Signed)
[facility]    CSN: [id number] Arrival date & time: 06/07/18  1748     History   Chief Complaint Chief Complaint  Patient presents with  . Rash    HPI Holly Peck is a 42 y.o. female.   Patient continues to have a pruritic rash on torso and extremities that has been gradually worsening.  She also reports a history of chronic eczema.  The itching awakens her at night.  She feels well otherwise.  The history is provided by the patient and the spouse.  Rash  Location: torso and extremities. Quality: dryness, itchiness, painful and redness   Quality: not blistering, not bruising, not burning, not draining, not peeling, not scaling, not swelling and not weeping   Pain details:    Quality:  Itching   Severity:  Moderate   Onset quality:  Gradual   Duration:  6 months   Timing:  Constant   Progression:  Worsening Severity:  Moderate Onset quality:  Gradual Duration:  6 months Timing:  Constant Progression:  Worsening Chronicity:  Chronic Context: not animal contact, not chemical exposure, not exposure to similar rash, not food, not hot tub use, not insect bite/sting, not medications, not new detergent/soap, not nuts, not plant contact, not pollen and not sick contacts   Relieved by:  Nothing Worsened by:  Nothing Ineffective treatments:  Anti-itch cream Associated symptoms: no abdominal pain, no diarrhea, no fatigue, no fever, no induration, no joint pain, no myalgias, no nausea and no sore throat     Past Medical History:  Diagnosis Date  . Anxiety   . Eczema   . History of shingles     There are no active problems to display for this patient.   No past surgical history on file.  OB History   None      Home Medications    Prior to Admission medications   Medication Sig Start Date End Date Taking? Authorizing Provider  clonazePAM (KLONOPIN) 1 MG tablet Take 1 mg by mouth 2 (two) times daily.    [provider]  cyclobenzaprine  (FLEXERIL) 10 MG tablet Take 1 tablet (10 mg total) by mouth at bedtime. If needed, for muscle relaxant 04/12/18   Lajean Manes, MD  DULoxetine (CYMBALTA) 60 MG capsule Take 60 mg by mouth daily.    [provider]  Eszopiclone 3 MG TABS Take 3 mg by mouth at bedtime. Take immediately before bedtime    [provider]  hydrOXYzine (VISTARIL) 50 MG capsule Take 1 capsule (50 mg total) by mouth every 6 (six) hours as needed. (for itching) 06/07/18   Lattie Haw, MD  lamoTRIgine (LAMICTAL) 200 MG tablet Take 200 mg by mouth daily.    [provider]  meloxicam (MOBIC) 7.5 MG tablet Take 1 twice a day as needed for pain. Take with food. (Do not take with Motrin or any other NSAID.) 04/12/18   Lajean Manes, MD  pramipexole (MIRAPEX) 1 MG tablet Take 2 mg by mouth 3 (three) times daily.    [provider]  predniSONE (DELTASONE) 20 MG tablet Take one tab by mouth twice daily for 4 days, then one daily for 3 days. Take with food. 06/07/18   Lattie Haw, MD  QUEtiapine (SEROQUEL) 100 MG tablet Take 100 mg by mouth at bedtime.    [provider]  triamcinolone cream (KENALOG) 0.1 % Apply 1 application topically 2 (two) times daily. 06/07/18   Lattie Haw, MD  valACYclovir (  VALTREX) 1000 MG tablet Take one by mouth 3 times a day for 7 days 12/22/17   Lajean Manes, MD    Family History History reviewed. No pertinent family history.  Social History Social History   Tobacco Use  . Smoking status: Never Smoker  . Smokeless tobacco: Never Used  Substance Use Topics  . Alcohol use: No    Frequency: Never  . Drug use: No     Allergies   Percocet [oxycodone-acetaminophen]   Review of Systems Review of Systems  Constitutional: Negative for fatigue and fever.  HENT: Negative for sore throat.   Gastrointestinal: Negative for abdominal pain, diarrhea and nausea.  Musculoskeletal: Negative for arthralgias and myalgias.  Skin: Positive for rash.    All other systems reviewed and are negative.    Physical Exam Triage Vital Signs ED Triage Vitals  Enc Vitals Group     BP 06/07/18 1820 113/76     Pulse Rate 06/07/18 1820 79     Resp 06/07/18 1820 18     Temp 06/07/18 1820 98.5 F (36.9 C)     Temp Source 06/07/18 1820 Oral     SpO2 06/07/18 1820 96 %     Weight 06/07/18 1821 250 lb (113.4 kg)     Height 06/07/18 1821 5\' 6"  (1.676 m)     Head Circumference --      Peak Flow --      Pain Score 06/07/18 1821 0     Pain Loc --      Pain Edu? --      Excl. in GC? --    No data found.  Updated Vital Signs BP 113/76 (BP Location: Right Arm)   Pulse 79   Temp 98.5 F (36.9 C) (Oral)   Resp 18   Ht 5\' 6"  (1.676 m)   Wt 113.4 kg   SpO2 96%   BMI 40.35 kg/m   Visual Acuity Right Eye Distance:   Left Eye Distance:   Bilateral Distance:    Right Eye Near:   Left Eye Near:    Bilateral Near:     Physical Exam  Constitutional: She appears well-developed and well-nourished. No distress.  HENT:  Head: Normocephalic.  Right Ear: External ear normal.  Left Ear: External ear normal.  Nose: Nose normal.  Mouth/Throat: No oropharyngeal exudate.  Eyes: Pupils are equal, round, and reactive to light. Conjunctivae are normal.  Neck: Normal range of motion.  Cardiovascular: Normal rate.  Pulmonary/Chest: Effort normal.  Musculoskeletal: She exhibits no edema.  Neurological: She is alert.  Skin: Skin is warm and dry.     Diffusely scattered maculopapular lesions on trunk and extremities, many with scaly surface.  None on palms or soles of.  No induration, fluctuance, or tenderness to palpation   Nursing note and vitals reviewed.    UC Treatments / Results  Labs (all labs ordered are listed, but only abnormal results are displayed) Labs Reviewed - No data to display  EKG None  Radiology No results found.  Procedures Procedures (including critical care time)  Medications Ordered in UC Medications   methylPREDNISolone sodium succinate (SOLU-MEDROL) 125 mg/2 mL injection 80 mg (has no administration in time range)    Initial Impression / Assessment and Plan / UC Course  I have reviewed the triage vital signs and the nursing notes.  Pertinent labs & imaging results that were available during my care of the patient were reviewed by me and considered in my medical decision making (see  chart for details).    Chronic eczematous rash, uncertain diagnosis.  Recommend evaluation by dermatologist to include biopsy.  For comfort and relief of itching, administered Solumedrol 80mg  IM.  Begin prednisone burst/taper tomorrow. Rx for Vistaril 50mg  May use triamcinolone cream 0.1% on selected areas. Will refer to dermatologist for further management.   Final Clinical Impressions(s) / UC Diagnoses   Final diagnoses:  Chronic dermatitis     Discharge Instructions     Begin prednisone Saturday 06/08/18.    ED Prescriptions    Medication Sig Dispense Auth. Provider   hydrOXYzine (VISTARIL) 50 MG capsule Take 1 capsule (50 mg total) by mouth every 6 (six) hours as needed. (for itching) 30 capsule Lattie Haw, MD   predniSONE (DELTASONE) 20 MG tablet Take one tab by mouth twice daily for 4 days, then one daily for 3 days. Take with food. 11 tablet Lattie Haw, MD   triamcinolone cream (KENALOG) 0.1 % Apply 1 application topically 2 (two) times daily. 45 g Lattie Haw, MD         Lattie Haw, MD 06/07/18 [phone]

## 2018-09-24 ENCOUNTER — Emergency Department (INDEPENDENT_AMBULATORY_CARE_PROVIDER_SITE_OTHER)
Admission: EM | Admit: 2018-09-24 | Discharge: 2018-09-24 | Disposition: A | Discharge status: Home / Self Care | Payer: BLUE CROSS/BLUE SHIELD | Source: Home / Self Care | Attending: Family Medicine | Admitting: Family Medicine

## 2018-09-24 ENCOUNTER — Other Ambulatory Visit: Payer: Self-pay

## 2018-09-24 DIAGNOSIS — L0291 Cutaneous abscess, unspecified: Secondary | ICD-10-CM | POA: Diagnosis not present

## 2018-09-24 DIAGNOSIS — L03112 Cellulitis of left axilla: Secondary | ICD-10-CM

## 2018-09-24 DIAGNOSIS — L02411 Cutaneous abscess of right axilla: Secondary | ICD-10-CM

## 2018-09-24 MED ORDER — CLINDAMYCIN HCL 300 MG PO CAPS: ORAL_CAPSULE | ORAL | 0 refills | Status: DC

## 2018-09-24 MED ORDER — PREDNISONE 20 MG PO TABS: ORAL_TABLET | ORAL | 0 refills | Status: DC

## 2018-09-24 NOTE — ED Provider Notes (Signed)
[name]    CSN: [id number] Arrival date & time: 09/24/18  1635     History   Chief Complaint Chief Complaint  Patient presents with  . Abscess    HPI Holly Peck is a 43 y.o. female.   Patient complains of onset of a recurrent painful nodule in her right axilla two days ago, followed by a similar nodule in her left axilla.  The nodule right axilla has been draining purulent fluid.  She feels well otherwise without fevers, chills, and sweats.  The history is provided by the patient and the spouse.  Abscess  Abscess location: both axillae. Size:  Right: 1.5cm by 2 cm   Left: 1.5cm by 3cm Abscess quality: draining, fluctuance, induration, painful and redness   Red streaking: no   Progression:  Worsening Pain details:    Quality:  Aching   Severity:  Moderate   Duration:  2 days   Timing:  Constant   Progression:  Worsening Chronicity:  Recurrent Relieved by:  Nothing Worsened by:  Draining/squeezing Ineffective treatments:  Draining/squeezing Associated symptoms: no fatigue, no fever, no nausea and no vomiting   Risk factors: prior abscess     Past Medical History:  Diagnosis Date  . Anxiety   . Eczema   . History of shingles     There are no active problems to display for this patient.   History reviewed. No pertinent surgical history.  OB History   No obstetric history on file.      Home Medications    Prior to Admission medications   Medication Sig Start Date End Date Taking? Authorizing Provider  lamoTRIgine (LAMICTAL) 200 MG tablet Take by mouth. 04/08/18  Yes [provider]  clindamycin (CLEOCIN) 300 MG capsule Take one cap PO every 8 hours 09/24/18   Lattie Haw, MD  clonazePAM (KLONOPIN) 1 MG tablet Take 1 mg by mouth 2 (two) times daily.    [provider]  cyclobenzaprine (FLEXERIL) 10 MG tablet Take 1 tablet (10 mg total) by mouth at bedtime. If needed, for muscle relaxant 04/12/18   Lajean Manes, MD    DULoxetine (CYMBALTA) 60 MG capsule Take 60 mg by mouth daily.    [provider]  Eszopiclone 3 MG TABS Take 3 mg by mouth at bedtime. Take immediately before bedtime    [provider]  hydrOXYzine (VISTARIL) 50 MG capsule Take 1 capsule (50 mg total) by mouth every 6 (six) hours as needed. (for itching) 06/07/18   Lattie Haw, MD  lamoTRIgine (LAMICTAL) 200 MG tablet Take 200 mg by mouth daily.    [provider]  meloxicam (MOBIC) 7.5 MG tablet Take 1 twice a day as needed for pain. Take with food. (Do not take with Motrin or any other NSAID.) 04/12/18   Lajean Manes, MD  pramipexole (MIRAPEX) 1 MG tablet Take 2 mg by mouth 3 (three) times daily.    [provider]  predniSONE (DELTASONE) 20 MG tablet Take one tab by mouth twice daily for 4 days, then one daily. Take with food. 09/24/18   Lattie Haw, MD  QUEtiapine (SEROQUEL) 100 MG tablet Take 100 mg by mouth at bedtime.    [provider]  triamcinolone cream (KENALOG) 0.1 % Apply 1 application topically 2 (two) times daily. 06/07/18   Lattie Haw, MD  valACYclovir (VALTREX) 1000 MG tablet Take one by mouth 3 times a day for 7 days 12/22/17   Lajean Manes, MD  Family History History reviewed. No pertinent family history.  Social History Social History   Tobacco Use  . Smoking status: Never Smoker  . Smokeless tobacco: Never Used  Substance Use Topics  . Alcohol use: No    Frequency: Never  . Drug use: No     Allergies   Percocet [oxycodone-acetaminophen]   Review of Systems Review of Systems  Constitutional: Negative for fatigue and fever.  Gastrointestinal: Negative for nausea and vomiting.  All other systems reviewed and are negative.    Physical Exam Triage Vital Signs ED Triage Vitals  Enc Vitals Group     BP --      Pulse Rate 09/24/18 1703 (!) 104     Resp 09/24/18 1703 20     Temp 09/24/18 1703 98.5 F (36.9 C)     Temp Source 09/24/18 1703 Oral      SpO2 09/24/18 1703 96 %     Weight 09/24/18 1704 249 lb (112.9 kg)     Height 09/24/18 1704 5\' 5"  (1.651 m)     Head Circumference --      Peak Flow --      Pain Score 09/24/18 1704 3     Pain Loc --      Pain Edu? --      Excl. in GC? --    No data found.  Updated Vital Signs Pulse (!) 104   Temp 98.5 F (36.9 C) (Oral)   Resp 20   Ht 5\' 5"  (1.651 m)   Wt 112.9 kg   SpO2 96%   BMI 41.44 kg/m   Visual Acuity Right Eye Distance:   Left Eye Distance:   Bilateral Distance:    Right Eye Near:   Left Eye Near:    Bilateral Near:     Physical Exam Vitals signs and nursing note reviewed.  Constitutional:      General: She is not in acute distress.    Appearance: She is obese. She is not diaphoretic.  HENT:     Head: Normocephalic.     Mouth/Throat:     Mouth: Mucous membranes are moist.  Eyes:     Pupils: Pupils are equal, round, and reactive to light.  Cardiovascular:     Rate and Rhythm: Tachycardia present.  Pulmonary:     Effort: Pulmonary effort is normal.  Lymphadenopathy:     Cervical: No cervical adenopathy.  Skin:    General: Skin is warm and dry.     Comments: Right axilla has an erythematous fluctuant abscess draining a small amount of purulent fluid.  Left axilla has an erythematous indurated tender area 1.5cm by 3cm.  Neurological:     Mental Status: She is alert.      UC Treatments / Results  Labs (all labs ordered are listed, but only abnormal results are displayed) Labs Reviewed  WOUND CULTURE    EKG None  Radiology No results found.  Procedures Procedures  Incise and drain cyst/abscess right axilla: Risks and benefits of procedure explained to patient and verbal consent obtained.  Using sterile technique, applied topical refrigerant spray followed by local anesthesia with 1% lidocaine with epinephrine, and cleansed affected area with Betadine and alcohol.. Identified the most fluctuant area of lesion and incised with #11  blade.  Wound culture taken.  Expressed blood and purulent material.  Inserted short length of Iodoform gauze packing.  Bandage applied.  Patient tolerated well   Medications Ordered in UC Medications - No data to display  Initial Impression / Assessment and Plan / UC Course  I have reviewed the triage vital signs and the nursing notes.  Pertinent labs & imaging results that were available during my care of the patient were reviewed by me and considered in my medical decision making (see chart for details).    Begin clindamycin 300mg  TID for staph coverage.  Wound culture from right axilla pending. Begin prednisone burst/taper. Return tomorrow for packing removal and dressing change. If lesion left axilla becomes fluctuant, may need I and D.   Final Clinical Impressions(s) / UC Diagnoses   Final diagnoses:  Abscess  Abscess of axilla, right  Cellulitis of axilla, left     Discharge Instructions     Leave bandage in place until follow-up visit tomorrow.  Keep bandage clean and dry. May take Tylenol as needed for pain..   ED Prescriptions    Medication Sig Dispense Auth. Provider   predniSONE (DELTASONE) 20 MG tablet Take one tab by mouth twice daily for 4 days, then one daily. Take with food. 12 tablet Lattie Haw, MD   clindamycin (CLEOCIN) 300 MG capsule Take one cap PO every 8 hours 30 capsule Lattie Haw, MD         Lattie Haw, MD 09/24/18 [phone]

## 2018-09-24 NOTE — Discharge Instructions (Addendum)
Leave bandage in place until followup visit tomorrow.  Keep bandage clean and dry.  May take Tylenol as needed for pain. 

## 2018-09-24 NOTE — ED Triage Notes (Signed)
Pt has abscess under both arms x 2 days

## 2018-09-27 ENCOUNTER — Telehealth: Payer: Self-pay

## 2018-09-27 LAB — WOUND CULTURE
MICRO NUMBER:: 53997
SPECIMEN QUALITY:: ADEQUATE

## 2018-09-27 NOTE — Telephone Encounter (Signed)
Attempted to call- no answer

## 2018-09-28 NOTE — Telephone Encounter (Signed)
Left voice message inquiring about patients status. Given WCX results. Encouraged patient to call with questions or concerns.

## 2019-02-07 ENCOUNTER — Telehealth: Payer: BLUE CROSS/BLUE SHIELD | Admitting: Family

## 2019-02-07 DIAGNOSIS — R0602 Shortness of breath: Secondary | ICD-10-CM

## 2019-02-07 DIAGNOSIS — R05 Cough: Secondary | ICD-10-CM

## 2019-02-07 DIAGNOSIS — R059 Cough, unspecified: Secondary | ICD-10-CM

## 2019-02-07 DIAGNOSIS — J069 Acute upper respiratory infection, unspecified: Secondary | ICD-10-CM

## 2019-02-07 MED ORDER — BENZONATATE 100 MG PO CAPS: 100.0000 mg | ORAL_CAPSULE | Freq: Three times a day (TID) | ORAL | 0 refills | Status: DC | PRN

## 2019-02-07 MED ORDER — ALBUTEROL SULFATE HFA 108 (90 BASE) MCG/ACT IN AERS: 2.0000 | INHALATION_SPRAY | RESPIRATORY_TRACT | 0 refills | Status: AC | PRN

## 2019-02-07 NOTE — Progress Notes (Signed)
Greater than 5 minutes, yet less than 10 minutes of time have been spent researching, coordinating, and implementing care for this patient today.  Thank you for the details you included in the comment boxes. Those details are very helpful in determining the best course of treatment for you and help [place] to provide the best care.  URGENT: If you "cannot get enough air" as you mentioned, and you are lightheaded, dizzy, feel you may lose consciousness, or are in distress, please call 911 or have a friend take you to the Emergency Department. We can provide supportive care remotely as long as symptoms are mild to moderate. As you do not have a current fever, you do not meet the full criteria for Covid-19, although there is a varied presentation in which some do not present with fever. That said, see plan below. If you feel you may have been exposed to someone else with similar symptoms or a + Covid test, you may have the virus. See below.  E-Visit for Corona Virus Screening  Based on your current symptoms, you may very well have the virus, however your symptoms are mild. Currently, not all patients are being tested. If the symptoms are mild and there is not a known exposure, performing the test is not indicated.  You have been enrolled in MyChart Home Monitoring for COVID-19. Daily you will receive a questionnaire within the MyChart website. Our COVID-19 response team will be monitoring your responses daily.   Coronavirus disease 2019 (COVID-19) is a respiratory illness that can spread from person to person. The virus that causes COVID-19 is a new virus that was first identified in the country of [place] but is now found in multiple other countries and has spread to the [place].  Symptoms associated with the virus are mild to severe fever, cough, and shortness of breath. There is currently no vaccine to protect against COVID-19, and there is no specific antiviral treatment for the virus.   To be  considered HIGH RISK for Coronavirus (COVID-19), you have to meet the following criteria:  . Traveled to [place], [place], [place], [place] or [place]; or in the [place] to [place], [place], [place], or [place]; and have fever, cough, and shortness of breath within the last 2 weeks of travel OR  . Been in close contact with a person diagnosed with COVID-19 within the last 2 weeks and have fever, cough, and shortness of breath  . IF YOU DO NOT MEET THESE CRITERIA, YOU ARE CONSIDERED LOW RISK FOR COVID-19.   It is vitally important that if you feel that you have an infection such as this virus or any other virus that you stay home and away from places where you may spread it to others.  You should self-quarantine for 14 days if you have symptoms that could potentially be coronavirus and avoid contact with people age 60 and older.   You can use medication such as A prescription cough medication called Tessalon Perles 100 mg. You may take 1-2 capsules every 8 hours as needed for cough and A prescription inhaler called Albuterol MDI 90 mcg /actuation 2 puffs every 4 hours as needed for shortness of breath, wheezing, cough  You may also take acetaminophen (Tylenol) as needed for fever.   Reduce your risk of any infection by using the same precautions used for avoiding the common cold or flu:  Marland Kitchen Wash your hands often with soap and warm water for at least 20 seconds.  If soap and water are not readily available, use an alcohol-based hand sanitizer with at least 60% alcohol.  . If coughing or sneezing, cover your mouth and nose by coughing or sneezing into the elbow areas of your shirt or coat, into a tissue or into your sleeve (not your hands). . Avoid shaking hands with others and consider head nods or verbal greetings only. . Avoid touching your eyes, nose, or mouth with unwashed hands.  . Avoid close contact with people who are sick. . Avoid places or events with large numbers of people in  one location, like concerts or sporting events. . Carefully consider travel plans you have or are making. . If you are planning any travel outside or inside the [place], visit the CDC's Travelers' Health webpage for the latest health notices. . If you have some symptoms but not all symptoms, continue to monitor at home and seek medical attention if your symptoms worsen. . If you are having a medical emergency, call 911.  HOME CARE . Only take medications as instructed by your medical team. . Drink plenty of fluids and get plenty of rest. . A steam or ultrasonic humidifier can help if you have congestion.   GET HELP RIGHT AWAY IF: . You develop worsening fever. . You become short of breath . You cough up blood. . Your symptoms become more severe MAKE SURE YOU   Understand these instructions.  Will watch your condition.  Will get help right away if you are not doing well or get worse.  Your e-visit answers were reviewed by a board certified advanced clinical practitioner to complete your personal care plan.  Depending on the condition, your plan could have included both over the counter or prescription medications.  If there is a problem please reply once you have received a response from your provider. Your safety is important to us.  If you have drug allergies check your prescription carefully.    You can use MyChart to ask questions about today's visit, request a non-urgent call back, or ask for a work or school excuse for 24 hours related to this e-Visit. If it has been greater than 24 hours you will need to follow up with your provider, or enter a new e-Visit to address those concerns. You will get an e-mail in the next two days asking about your experience.  I hope that your e-visit has been valuable and will speed your recovery. Thank you for using e-visits.

## 2019-03-10 IMAGING — DX DG ELBOW COMPLETE 3+V*L*
4 series · 4 of 4 positions shown · non-contrast
Comparison: None.

CLINICAL DATA: Fall.  Elbow pain

EXAM:
LEFT ELBOW - COMPLETE 3+ VIEW

[elbow ap]
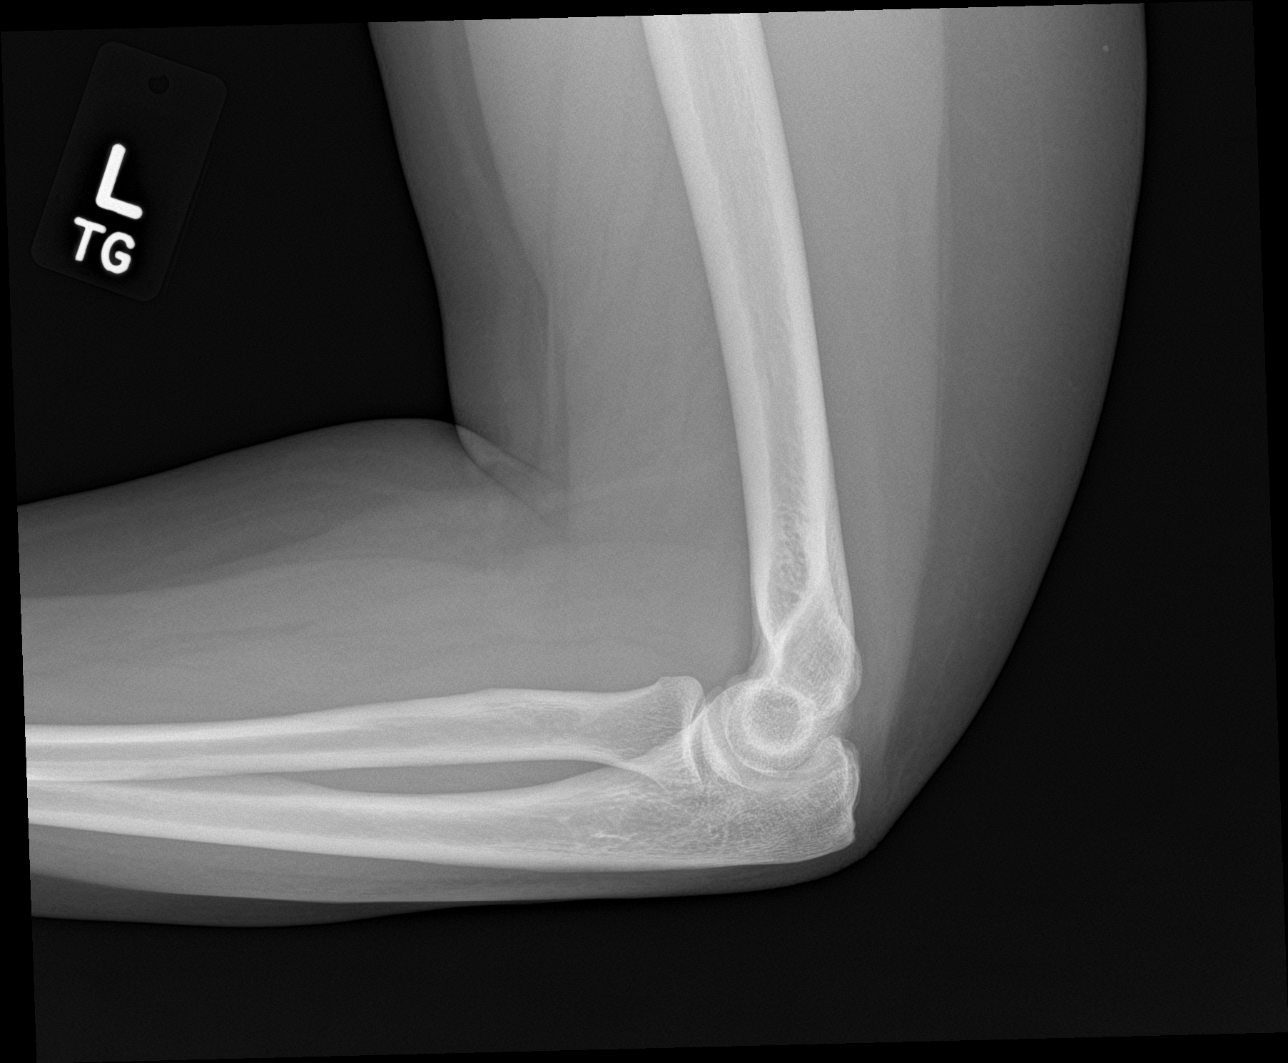

[elbow lat]
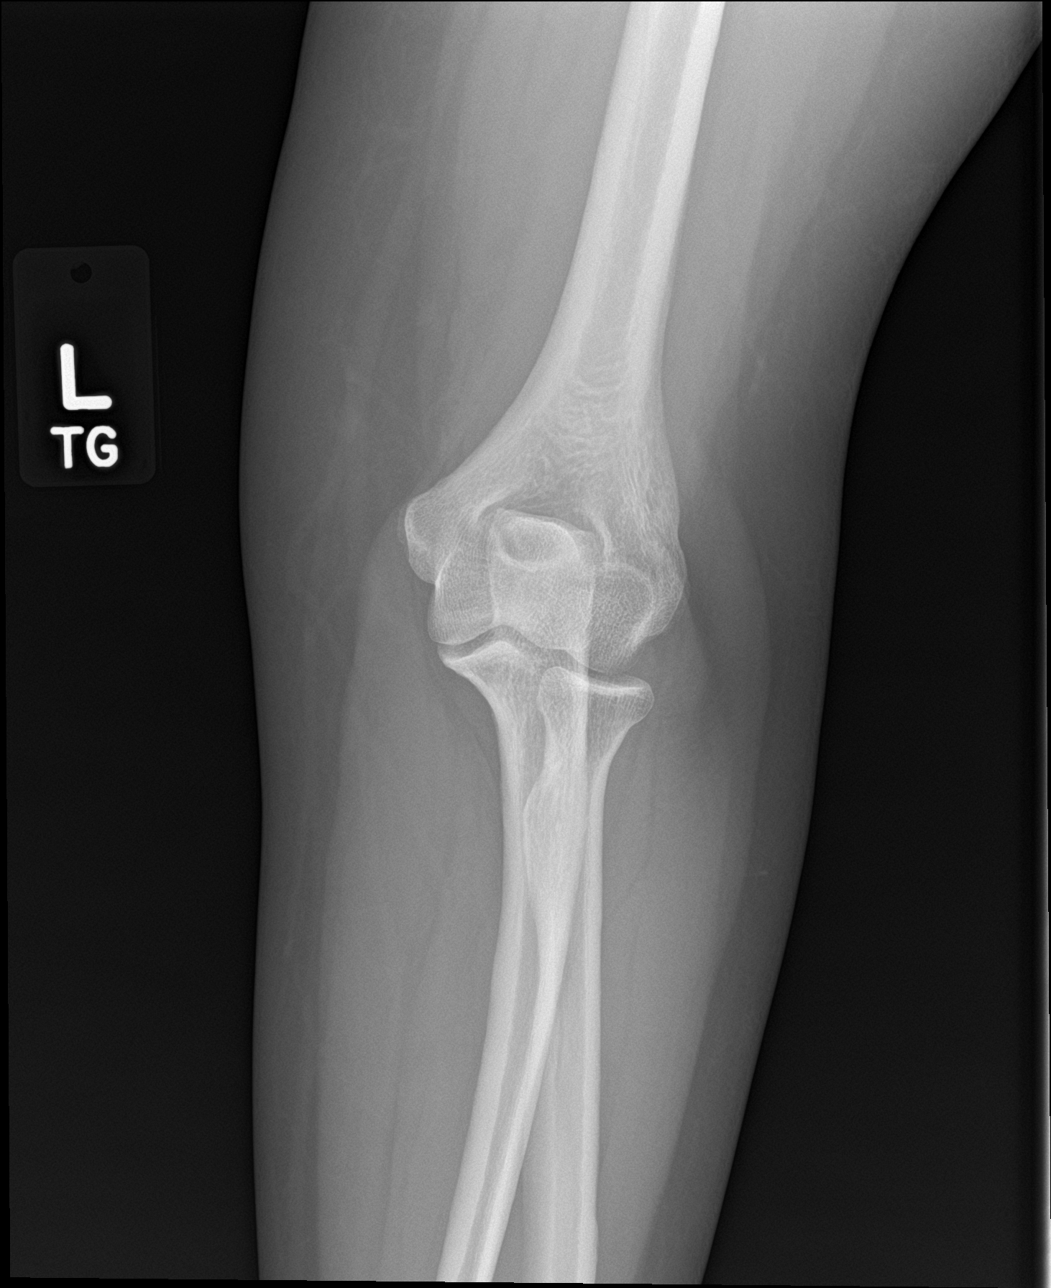

[elbow obl (1 of 2)]
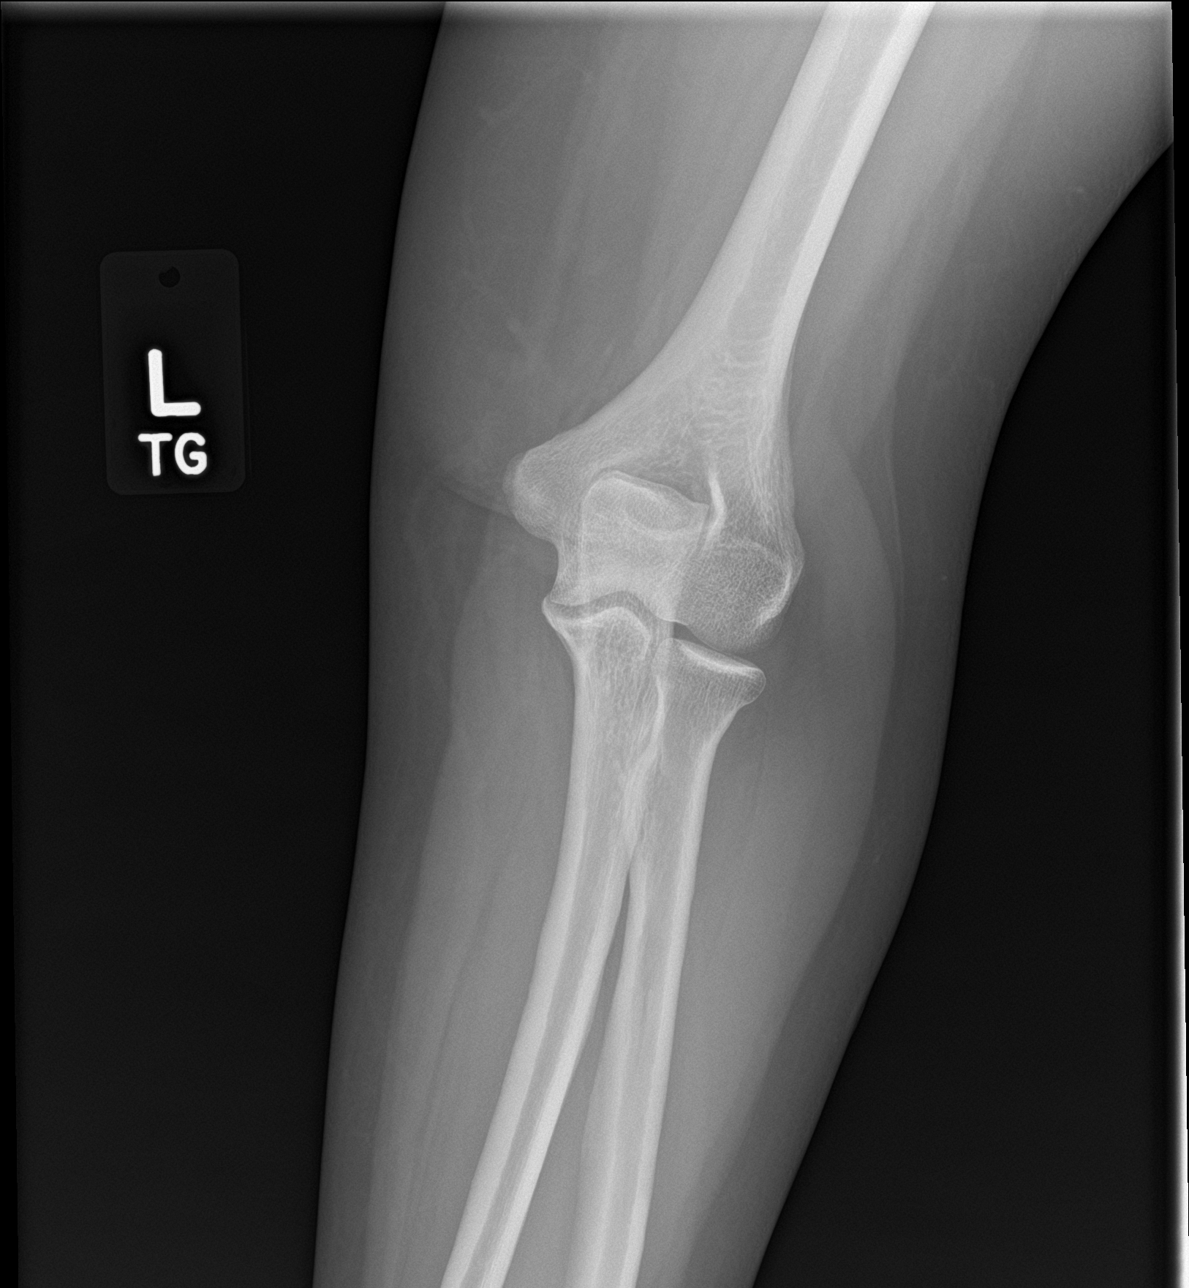

[elbow obl (2 of 2)]
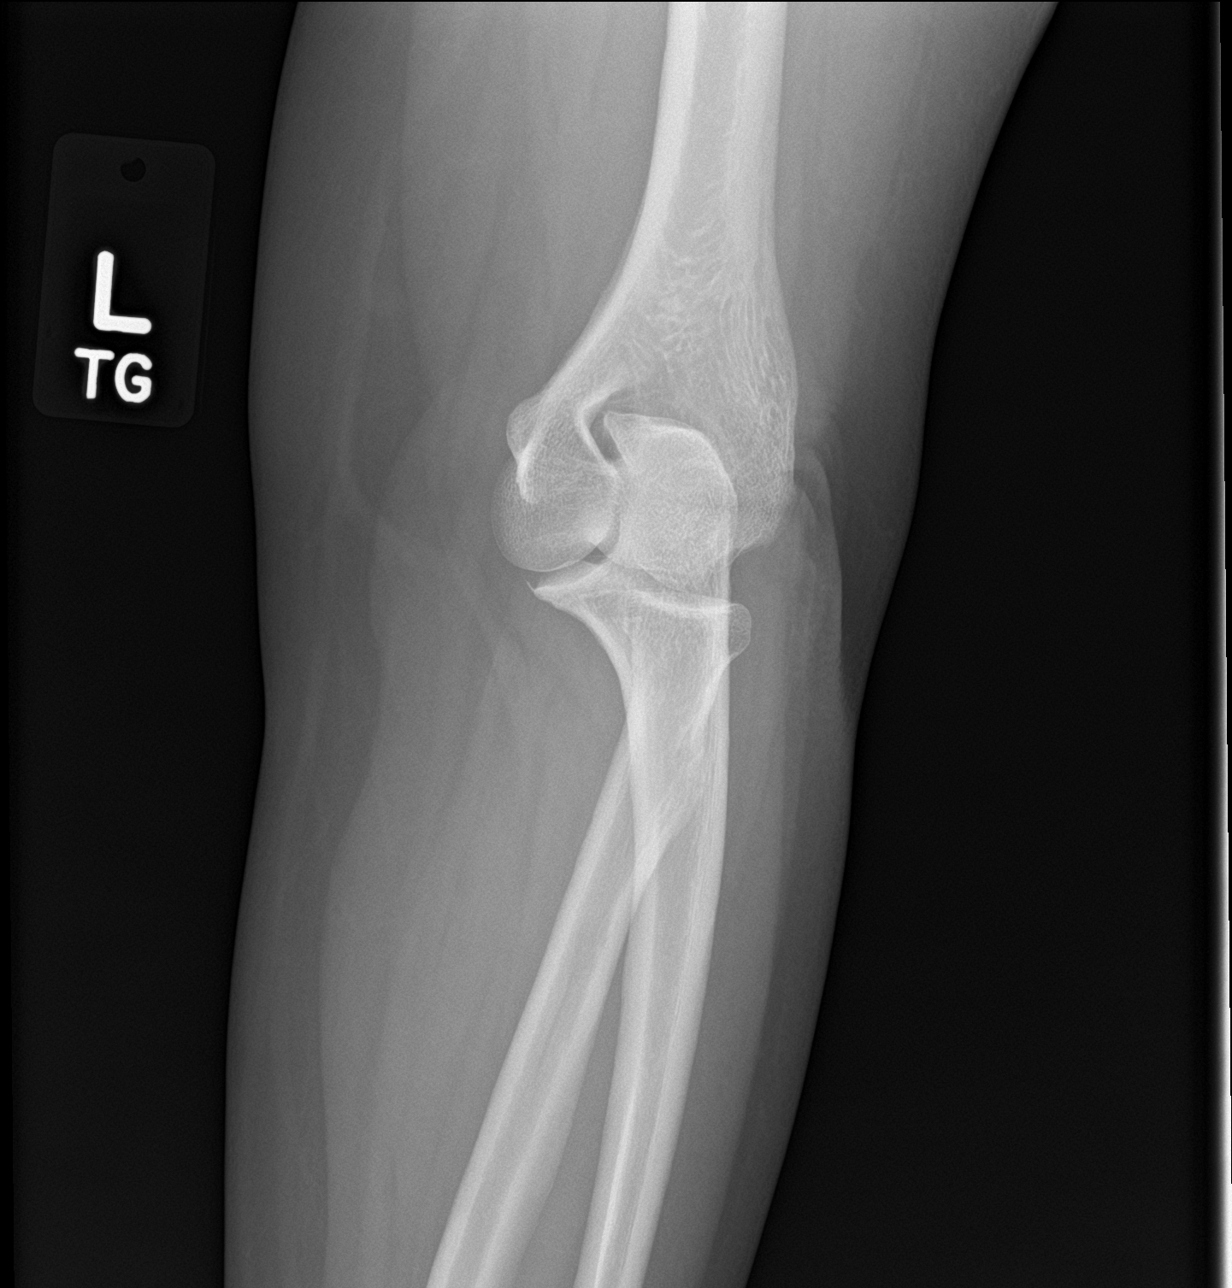

[4 of 4 positions shown; findings below may reference images not displayed]

FINDINGS: There is no evidence of fracture, dislocation, or joint effusion.
There is no evidence of arthropathy or other focal bone abnormality.
Soft tissues are unremarkable.
IMPRESSION: Negative.

## 2019-05-09 ENCOUNTER — Encounter: Payer: Self-pay | Admitting: Emergency Medicine

## 2019-05-09 ENCOUNTER — Other Ambulatory Visit: Payer: Self-pay

## 2019-05-09 ENCOUNTER — Emergency Department (INDEPENDENT_AMBULATORY_CARE_PROVIDER_SITE_OTHER)
Admission: EM | Admit: 2019-05-09 | Discharge: 2019-05-09 | Disposition: A | Discharge status: Home / Self Care | Payer: BC Managed Care – PPO | Source: Home / Self Care | Attending: Family Medicine | Admitting: Family Medicine

## 2019-05-09 DIAGNOSIS — L03111 Cellulitis of right axilla: Secondary | ICD-10-CM | POA: Diagnosis not present

## 2019-05-09 HISTORY — DX: Gastro-esophageal reflux disease without esophagitis: K21.9

## 2019-05-09 HISTORY — DX: Insomnia, unspecified: G47.00

## 2019-05-09 MED ORDER — TRAMADOL HCL 50 MG PO TABS
ORAL_TABLET | ORAL | 0 refills | Status: AC
Start: 2019-05-09 — End: ?

## 2019-05-09 MED ORDER — CLINDAMYCIN HCL 300 MG PO CAPS: ORAL_CAPSULE | ORAL | 0 refills | Status: AC

## 2019-05-09 NOTE — Discharge Instructions (Addendum)
Begin applying warm compress several times daily.  May take Tylenol with tramadol if needed for pain.  If symptoms become significantly worse during the night or over the weekend, proceed to the local emergency room.

## 2019-05-09 NOTE — ED Triage Notes (Signed)
RT Axilla abscess x 2 days

## 2019-05-09 NOTE — ED Provider Notes (Signed)
[facility]    CSN: [id number] Arrival date & time: 05/09/19  1735      History   Chief Complaint Chief Complaint  Patient presents with  . Abscess    HPI Holly Peck is a 43 y.o. female.   Patient complains of two day history of tender swollen area right axilla and is concerned that she may be developing an abscess.  She feels well otherwise and denies fevers, chills, and sweats.  The history is provided by the patient.  Abscess Abscess location: right axilla. Size:  1.5cm by 2cm Abscess quality: induration, painful and redness   Abscess quality: not draining, no fluctuance, no itching, no warmth and not weeping   Red streaking: no   Duration:  2 days Progression:  Worsening Pain details:    Quality:  Aching and dull   Severity:  Moderate   Duration:  2 days   Timing:  Constant   Progression:  Worsening Chronicity:  Recurrent Context: not skin injury   Relieved by:  Nothing Exacerbated by: contact. Ineffective treatments:  None tried Associated symptoms: no anorexia, no fatigue, no fever and no nausea   Risk factors: prior abscess     Past Medical History:  Diagnosis Date  . Anxiety   . Eczema   . GERD (gastroesophageal reflux disease)   . History of shingles   . Insomnia     There are no active problems to display for this patient.   History reviewed. No pertinent surgical history.  OB History   No obstetric history on file.      Home Medications    Prior to Admission medications   Medication Sig Start Date End Date Taking? Authorizing Provider  buPROPion (WELLBUTRIN XL) 300 MG 24 hr tablet Take 300 mg by mouth daily.   Yes [provider]  diphenhydramine-acetaminophen (TYLENOL PM) 25-500 MG TABS tablet Take 1 tablet by mouth at bedtime as needed.   Yes [provider]  famotidine (PEPCID) 40 MG tablet Take 40 mg by mouth daily.   Yes [provider]  Melatonin 12 MG TABS Take by mouth.   Yes [provider]  traZODone (DESYREL) 50 MG tablet Take 50 mg by mouth at bedtime.   Yes [provider]  albuterol (VENTOLIN HFA) 108 (90 Base) MCG/ACT inhaler Inhale 2 puffs into the lungs every 4 (four) hours as needed for wheezing or shortness of breath. 02/07/19   Withrow, Elyse Jarvis, FNP  clindamycin (CLEOCIN) 300 MG capsule Take one cap PO Q6hr. 05/09/19   Kandra Nicolas, MD  clonazePAM (KLONOPIN) 1 MG tablet Take 1 mg by mouth 2 (two) times daily.    [provider]  DULoxetine (CYMBALTA) 60 MG capsule Take 60 mg by mouth daily.    [provider]  lamoTRIgine (LAMICTAL) 200 MG tablet Take 200 mg by mouth daily.    [provider]  lamoTRIgine (LAMICTAL) 200 MG tablet Take by mouth. 04/08/18   [provider]  traMADol Veatrice Bourbon) 50 MG tablet Take one tab PO Q6hr PRN pain 05/09/19   Kandra Nicolas, MD    Family History Family History  Problem Relation Age of Onset  . Hyperlipidemia Mother   . Hyperlipidemia Father   . Diabetes Father     Social History Social History   Tobacco Use  . Smoking status: Never Smoker  . Smokeless tobacco: Never Used  Substance Use Topics  . Alcohol use: No    Frequency: Never  .  Drug use: No     Allergies   Percocet [oxycodone-acetaminophen]   Review of Systems Review of Systems  Constitutional: Negative for fatigue and fever.  Gastrointestinal: Negative for anorexia and nausea.  All other systems reviewed and are negative.    Physical Exam Triage Vital Signs ED Triage Vitals  Enc Vitals Group     BP 05/09/19 1809 116/65     Pulse Rate 05/09/19 1809 (!) 113     Resp --      Temp 05/09/19 1809 98.6 F (37 C)     Temp Source 05/09/19 1809 Oral     SpO2 05/09/19 1809 97 %     Weight 05/09/19 1810 270 lb (122.5 kg)     Height 05/09/19 1810 5\' 5"  (1.651 m)     Head Circumference --      Peak Flow --      Pain Score 05/09/19 1809 10     Pain Loc --      Pain Edu? --      Excl. in [place]? --     No data found.  Updated Vital Signs BP 116/65 (BP Location: Right Arm)   Pulse (!) 113   Temp 98.6 F (37 C) (Oral)   Ht 5\' 5"  (1.651 m)   Wt 122.5 kg   SpO2 97%   BMI 44.93 kg/m   Visual Acuity Right Eye Distance:   Left Eye Distance:   Bilateral Distance:    Right Eye Near:   Left Eye Near:    Bilateral Near:     Physical Exam Vitals signs and nursing note reviewed.  Constitutional:      General: She is not in acute distress.    Appearance: She is obese.  HENT:     Head: Normocephalic.     Right Ear: External ear normal.     Left Ear: External ear normal.     Nose: Nose normal.     Mouth/Throat:     Mouth: Mucous membranes are moist.  Eyes:     Pupils: Pupils are equal, round, and reactive to light.  Cardiovascular:     Rate and Rhythm: Normal rate.  Pulmonary:     Effort: Pulmonary effort is normal.  Lymphadenopathy:     Cervical: No cervical adenopathy.  Skin:    General: Skin is warm and dry.     Comments: Right axilla has an indurated, tender erythematous area measuring 1.5cm by 2 cm.  Neurological:     Mental Status: She is alert.      UC Treatments / Results  Labs (all labs ordered are listed, but only abnormal results are displayed) Labs Reviewed - No data to display  EKG   Radiology No results found.  Procedures Procedures (including critical care time)  Medications Ordered in UC Medications - No data to display  Initial Impression / Assessment and Plan / UC Course  I have reviewed the triage vital signs and the nursing notes.  Pertinent labs & imaging results that were available during my care of the patient were reviewed by me and considered in my medical decision making (see chart for details).    Suspect staph infection.  Begin clindamycin 300mg  Q6hr. Rx for tramadol 50mg  (#12, no refill). Return if not improving 2 days.   Final Clinical Impressions(s) / UC Diagnoses   Final diagnoses:  Cellulitis of axilla, right      Discharge Instructions     Begin applying warm compress several times daily.  May  take Tylenol with tramadol if needed for pain.  If symptoms become significantly worse during the night or over the weekend, proceed to the local emergency room.     ED Prescriptions    Medication Sig Dispense Auth. Provider   clindamycin (CLEOCIN) 300 MG capsule Take one cap PO Q6hr. 28 capsule Lattie Haw, MD   traMADol Janean Sark) 50 MG tablet Take one tab PO Q6hr PRN pain 12 tablet Lattie Haw, MD        Lattie Haw, MD 05/12/19 [id number]

## 2019-07-16 ENCOUNTER — Emergency Department (INDEPENDENT_AMBULATORY_CARE_PROVIDER_SITE_OTHER)
Admission: EM | Admit: 2019-07-16 | Discharge: 2019-07-16 | Disposition: A | Discharge status: Home / Self Care | Payer: BC Managed Care – PPO | Source: Home / Self Care

## 2019-07-16 ENCOUNTER — Other Ambulatory Visit: Payer: Self-pay

## 2019-07-16 DIAGNOSIS — N3000 Acute cystitis without hematuria: Secondary | ICD-10-CM

## 2019-07-16 DIAGNOSIS — R300 Dysuria: Secondary | ICD-10-CM | POA: Diagnosis not present

## 2019-07-16 LAB — POCT URINALYSIS DIP (MANUAL ENTRY)
Bilirubin, UA: NEGATIVE
Blood, UA: NEGATIVE
Glucose, UA: NEGATIVE mg/dL
Ketones, POC UA: NEGATIVE mg/dL
Nitrite, UA: NEGATIVE
Protein Ur, POC: NEGATIVE mg/dL
Spec Grav, UA: 1.015 (ref 1.010–1.025)
Urobilinogen, UA: 0.2 E.U./dL
pH, UA: 6.5 (ref 5.0–8.0)

## 2019-07-16 MED ORDER — PHENAZOPYRIDINE HCL 200 MG PO TABS: 200.0000 mg | ORAL_TABLET | Freq: Three times a day (TID) | ORAL | 0 refills | Status: AC

## 2019-07-16 MED ORDER — CEPHALEXIN 500 MG PO CAPS: 500.0000 mg | ORAL_CAPSULE | Freq: Two times a day (BID) | ORAL | 0 refills | Status: AC

## 2019-07-16 NOTE — ED Provider Notes (Signed)
[name]    CSN: [id number] Arrival date & time: 07/16/19  1701      History   Chief Complaint Chief Complaint  Patient presents with  . Back Pain  . Dysuria    HPI Matina Rodier is a 43 y.o. female.   HPI Tran Randle is a 43 y.o. female presenting to UC with c/o 2 days of dysuria described as burning, associated generalized fatigue and weakness as well as Right flank pain radiating into Right groin on occasion.  She believes she may have had a UTI in the past but that would have been many years ago, she does not recall which symptoms she had at that time. She has not tried any medications for current symptoms. Denies fever, chills, n/v/d.    Past Medical History:  Diagnosis Date  . Anxiety   . Eczema   . GERD (gastroesophageal reflux disease)   . History of shingles   . Insomnia     There are no active problems to display for this patient.   History reviewed. No pertinent surgical history.  OB History   No obstetric history on file.      Home Medications    Prior to Admission medications   Medication Sig Start Date End Date Taking? Authorizing Provider  diphenhydramine-acetaminophen (TYLENOL PM) 25-500 MG TABS tablet Take 1 tablet by mouth at bedtime as needed.   Yes [provider]  eszopiclone (LUNESTA) 2 MG TABS tablet Take 2 mg by mouth at bedtime as needed for sleep. Take immediately before bedtime   Yes [provider]  LORazepam (ATIVAN) 1 MG tablet Take 1 mg by mouth every 8 (eight) hours.   Yes [provider]  Multiple Vitamin (MULTIVITAMIN) tablet Take 1 tablet by mouth daily.   Yes [provider]  albuterol (VENTOLIN HFA) 108 (90 Base) MCG/ACT inhaler Inhale 2 puffs into the lungs every 4 (four) hours as needed for wheezing or shortness of breath. 02/07/19   Withrow, Everardo All, FNP  buPROPion (WELLBUTRIN XL) 300 MG 24 hr tablet Take 300 mg by mouth daily.    [provider]  cephALEXin (KEFLEX) 500  MG capsule Take 1 capsule (500 mg total) by mouth 2 (two) times daily. 07/16/19   Lurene Shadow, PA-C  clindamycin (CLEOCIN) 300 MG capsule Take one cap PO Q6hr. 05/09/19   Lattie Haw, MD  DULoxetine (CYMBALTA) 60 MG capsule Take 60 mg by mouth daily.    [provider]  famotidine (PEPCID) 40 MG tablet Take 40 mg by mouth daily.    [provider]  lamoTRIgine (LAMICTAL) 200 MG tablet Take by mouth. 04/08/18   [provider]  Melatonin 12 MG TABS Take by mouth.    [provider]  phenazopyridine (PYRIDIUM) 200 MG tablet Take 1 tablet (200 mg total) by mouth 3 (three) times daily. 07/16/19   Lurene Shadow, PA-C  traMADol Janean Sark) 50 MG tablet Take one tab PO Q6hr PRN pain 05/09/19   Lattie Haw, MD  traZODone (DESYREL) 50 MG tablet Take 50 mg by mouth at bedtime.    [provider]  VRAYLAR capsule Take 3 mg by mouth. 07/16/19   [provider]    Family History Family History  Problem Relation Age of Onset  . Hyperlipidemia Mother   . Hyperlipidemia Father   . Diabetes Father     Social History Social History   Tobacco Use  . Smoking status: Never Smoker  .  Smokeless tobacco: Never Used  Substance Use Topics  . Alcohol use: No    Frequency: Never  . Drug use: No     Allergies   Percocet [oxycodone-acetaminophen]   Review of Systems Review of Systems  Constitutional: Positive for fatigue. Negative for chills and fever.  Gastrointestinal: Negative for abdominal pain, diarrhea, nausea and vomiting.  Genitourinary: Positive for dysuria, flank pain (Right), frequency and urgency. Negative for hematuria, pelvic pain, vaginal bleeding, vaginal discharge and vaginal pain.  Musculoskeletal: Positive for back pain (Right mid to lower).  Neurological: Positive for weakness. Negative for dizziness, light-headedness and headaches.     Physical Exam Triage Vital Signs ED Triage Vitals  Enc Vitals Group     BP  07/16/19 1715 124/77     Pulse Rate 07/16/19 1715 100     Resp 07/16/19 1715 18     Temp 07/16/19 1715 98.3 F (36.8 C)     Temp Source 07/16/19 1715 Oral     SpO2 07/16/19 1715 94 %     Weight 07/16/19 1716 271 lb (122.9 kg)     Height 07/16/19 1716 5\' 5"  (1.651 m)     Head Circumference --      Peak Flow --      Pain Score 07/16/19 1716 8     Pain Loc --      Pain Edu? --      Excl. in [place]? --    No data found.  Updated Vital Signs BP 124/77 (BP Location: Right Arm)   Pulse 100   Temp 98.3 F (36.8 C) (Oral)   Resp 18   Ht 5\' 5"  (1.651 m)   Wt 271 lb (122.9 kg)   LMP 07/02/2019 (Approximate)   SpO2 94%   BMI 45.10 kg/m   Visual Acuity Right Eye Distance:   Left Eye Distance:   Bilateral Distance:    Right Eye Near:   Left Eye Near:    Bilateral Near:     Physical Exam Vitals signs and nursing note reviewed.  Constitutional:      General: She is not in acute distress.    Appearance: Normal appearance. She is well-developed. She is not ill-appearing.  HENT:     Head: Normocephalic and atraumatic.  Neck:     Musculoskeletal: Normal range of motion.  Cardiovascular:     Rate and Rhythm: Normal rate and regular rhythm.  Pulmonary:     Effort: Pulmonary effort is normal. No respiratory distress.     Breath sounds: Normal breath sounds.  Abdominal:     General: There is no distension.     Palpations: Abdomen is soft.     Tenderness: There is no abdominal tenderness. There is no right CVA tenderness or left CVA tenderness.  Musculoskeletal: Normal range of motion.  Skin:    General: Skin is warm and dry.  Neurological:     Mental Status: She is alert and oriented to person, place, and time.  Psychiatric:        Behavior: Behavior normal.      UC Treatments / Results  Labs (all labs ordered are listed, but only abnormal results are displayed) Labs Reviewed  POCT URINALYSIS DIP (MANUAL ENTRY) - Abnormal; Notable for the following components:       Result Value   Leukocytes, UA Trace (*)    All other components within normal limits  URINE CULTURE    EKG   Radiology No results found.  Procedures Procedures (including critical care  time)  Medications Ordered in UC Medications - No data to display  Initial Impression / Assessment and Plan / UC Course  I have reviewed the triage vital signs and the nursing notes.  Pertinent labs & imaging results that were available during my care of the patient were reviewed by me and considered in my medical decision making (see chart for details).    Hx and UA c/w mild/early UTI Culture sent Due to flank pain, despite tenderness noted on exam, and reported generalized fatigue/weakness, will start pt on Keflex while culture pending. AVS provided.  Final Clinical Impressions(s) / UC Diagnoses   Final diagnoses:  Dysuria  Acute cystitis without hematuria     Discharge Instructions      Please take your antibiotic as prescribed. A urine culture has been sent to check the severity of your urinary infection and to determine if you are on the most appropriate antibiotic. The results should come back within 2-3 days and you will be notified even if no medication change is needed.  Please stay well hydrated and follow up with your family doctor in 1 week if not improving, sooner if worsening.     ED Prescriptions    Medication Sig Dispense Auth. Provider   cephALEXin (KEFLEX) 500 MG capsule Take 1 capsule (500 mg total) by mouth 2 (two) times daily. 14 capsule Waylan RocherPhelps, Laney Bagshaw O, PA-C   phenazopyridine (PYRIDIUM) 200 MG tablet Take 1 tablet (200 mg total) by mouth 3 (three) times daily. 6 tablet Lurene ShadowPhelps, Orris Perin O, [place]-C     I have reviewed the PDMP during this encounter.   Lurene Shadowhelps, Casmira Cramer O, [place]-C 07/16/19 1857

## 2019-07-16 NOTE — Discharge Instructions (Signed)
  Please take your antibiotic as prescribed. A urine culture has been sent to check the severity of your urinary infection and to determine if you are on the most appropriate antibiotic. The results should come back within 2-3 days and you will be notified even if no medication change is needed.  Please stay well hydrated and follow up with your family doctor in 1 week if not improving, sooner if worsening.  

## 2019-07-16 NOTE — ED Triage Notes (Signed)
Pt c/o dysuria x 2 days. Also experiencing some RT sided lower back pain that sometimes takes her breath away. Has not taken any AZO.

## 2019-07-18 LAB — URINE CULTURE
MICRO NUMBER:: 1065408
SPECIMEN QUALITY:: ADEQUATE

## 2020-09-11 ENCOUNTER — Emergency Department: Admit: 2020-09-11 | Discharge status: Against Medical Advice | Payer: Self-pay

## 2020-09-11 ENCOUNTER — Encounter: Payer: Self-pay | Admitting: Emergency Medicine

## 2020-09-11 ENCOUNTER — Emergency Department (INDEPENDENT_AMBULATORY_CARE_PROVIDER_SITE_OTHER)
Admission: EM | Admit: 2020-09-11 | Discharge: 2020-09-11 | Disposition: A | Discharge status: Home / Self Care | Payer: BC Managed Care – PPO | Source: Home / Self Care

## 2020-09-11 ENCOUNTER — Other Ambulatory Visit: Payer: Self-pay

## 2020-09-11 DIAGNOSIS — U071 COVID-19: Secondary | ICD-10-CM | POA: Diagnosis not present

## 2020-09-11 DIAGNOSIS — J029 Acute pharyngitis, unspecified: Secondary | ICD-10-CM

## 2020-09-11 LAB — POCT RAPID STREP A (OFFICE): Rapid Strep A Screen: NEGATIVE

## 2020-09-11 LAB — POC SARS CORONAVIRUS 2 AG -  ED: SARS Coronavirus 2 Ag: POSITIVE — AB

## 2020-09-11 NOTE — ED Provider Notes (Signed)
[name]    CSN: [id number] Arrival date & time: 09/11/20  1108      History   Chief Complaint Chief Complaint  Patient presents with  . Sore Throat    HPI Holly Peck is a 45 y.o. female.   Patient complains of sore throat.  There is some nonproductive cough and wheeze.  She has been vaccinated against Covid.  HPI  Past Medical History:  Diagnosis Date  . Anxiety   . Eczema   . GERD (gastroesophageal reflux disease)   . History of shingles   . Insomnia     There are no problems to display for this patient.   History reviewed. No pertinent surgical history.  OB History   No obstetric history on file.      Home Medications    Prior to Admission medications   Medication Sig Start Date End Date Taking? Authorizing Provider  albuterol (VENTOLIN HFA) 108 (90 Base) MCG/ACT inhaler Inhale 2 puffs into the lungs every 4 (four) hours as needed for wheezing or shortness of breath. 02/07/19  Yes Withrow, Everardo All, FNP  buPROPion (WELLBUTRIN XL) 300 MG 24 hr tablet Take 300 mg by mouth daily.   Yes [provider]  famotidine (PEPCID) 40 MG tablet Take 40 mg by mouth daily.   Yes [provider]  lamoTRIgine (LAMICTAL) 200 MG tablet Take by mouth. 04/08/18  Yes [provider]  lithium carbonate 300 MG capsule Take by mouth.   Yes [provider]  LORazepam (ATIVAN) 1 MG tablet Take 1 mg by mouth every 8 (eight) hours.   Yes [provider]  Melatonin 12 MG TABS Take by mouth.   Yes [provider]  Multiple Vitamin (MULTIVITAMIN) tablet Take 1 tablet by mouth daily.   Yes [provider]  traZODone (DESYREL) 50 MG tablet Take 50 mg by mouth at bedtime.   Yes [provider]  VRAYLAR capsule Take 3 mg by mouth. 07/16/19  Yes [provider]  cephALEXin (KEFLEX) 500 MG capsule Take 1 capsule (500 mg total) by mouth 2 (two) times daily. 07/16/19   Lurene Shadow, PA-C  clindamycin  (CLEOCIN) 300 MG capsule Take one cap PO Q6hr. 05/09/19   Lattie Haw, MD  diphenhydramine-acetaminophen (TYLENOL PM) 25-500 MG TABS tablet Take 1 tablet by mouth at bedtime as needed.    [provider]  DULoxetine (CYMBALTA) 60 MG capsule Take 60 mg by mouth daily.    [provider]  eszopiclone (LUNESTA) 2 MG TABS tablet Take 2 mg by mouth at bedtime as needed for sleep. Take immediately before bedtime    [provider]  phenazopyridine (PYRIDIUM) 200 MG tablet Take 1 tablet (200 mg total) by mouth 3 (three) times daily. 07/16/19   Lurene Shadow, PA-C  traMADol Janean Sark) 50 MG tablet Take one tab PO Q6hr PRN pain 05/09/19   Lattie Haw, MD    Family History Family History  Problem Relation Age of Onset  . Hyperlipidemia Mother   . Hyperlipidemia Father   . Diabetes Father     Social History Social History   Tobacco Use  . Smoking status: Never Smoker  . Smokeless tobacco: Never Used  Vaping Use  . Vaping Use: Never used  Substance Use Topics  . Alcohol use: No  . Drug use: No     Allergies   Percocet [oxycodone-acetaminophen]   Review of Systems Review of Systems  Constitutional: Positive for fever.  HENT: Positive for sore throat.   Respiratory: Positive for cough.   All other systems reviewed and are negative.    Physical Exam Triage Vital Signs ED Triage Vitals  Enc Vitals Group     BP 09/11/20 1155 135/84     Pulse Rate 09/11/20 1155 (!) 117     Resp --      Temp 09/11/20 1155 98.9 F (37.2 C)     Temp Source 09/11/20 1155 Oral     SpO2 09/11/20 1155 95 %     Weight 09/11/20 1156 269 lb (122 kg)     Height 09/11/20 1156 5\' 5"  (1.651 m)     Head Circumference --      Peak Flow --      Pain Score 09/11/20 1156 7     Pain Loc --      Pain Edu? --      Excl. in [place]? --    No data found.  Updated Vital Signs BP 135/84 (BP Location: Left Arm)   Pulse (!) 117   Temp 98.9 F (37.2 C) (Oral)   Ht 5\' 5"  (1.651 m)    Wt 122 kg   LMP 08/30/2020   SpO2 95%   BMI 44.76 kg/m   Visual Acuity Right Eye Distance:   Left Eye Distance:   Bilateral Distance:    Right Eye Near:   Left Eye Near:    Bilateral Near:     Physical Exam Vitals and nursing note reviewed.  Constitutional:      Appearance: She is well-developed. She is obese.  HENT:     Head: Normocephalic.     Right Ear: Tympanic membrane normal.     Left Ear: Tympanic membrane normal.     Mouth/Throat:     Mouth: Mucous membranes are moist.     Pharynx: No posterior oropharyngeal erythema.  Cardiovascular:     Rate and Rhythm: Regular rhythm. Tachycardia present.  Pulmonary:     Effort: Pulmonary effort is normal.     Breath sounds: Normal breath sounds.  Neurological:     Mental Status: She is alert.      UC Treatments / Results  Labs (all labs ordered are listed, but only abnormal results are displayed) Labs Reviewed  STREP A DNA PROBE  POCT RAPID STREP A (OFFICE)    EKG   Radiology No results found.  Procedures Procedures (including critical care time)  Medications Ordered in UC Medications - No data to display  Initial Impression / Assessment and Plan / UC Course  I have reviewed the triage vital signs and the nursing notes.  Pertinent labs & imaging results that were available during my care of the patient were reviewed by me and considered in my medical decision making (see chart for details).     Pharyngitis due to Covid Final Clinical Impressions(s) / UC Diagnoses   Final diagnoses:  Acute pharyngitis, unspecified etiology   Discharge Instructions   None    ED Prescriptions    None     PDMP not reviewed this encounter.   , MD 09/11/20 [phone]

## 2020-09-11 NOTE — ED Triage Notes (Signed)
Patient c/o sore throat x 2 days with fever, exposed to strep throat.  Patient also c/o dry cough.  Taken Motrin.  Patient is vaccinated.

## 2020-09-13 LAB — STREP A DNA PROBE: Group A Strep Probe: NOT DETECTED
